# Patient Record
Sex: Male | Born: 1945 | Race: White | Hispanic: No | Marital: Married | Smoking: Former smoker
Health system: Southern US, Community
[De-identification: ages and names within clinical notes are randomized; demographics above are authoritative.]

---

## 2013-07-08 ENCOUNTER — Inpatient Hospital Stay (HOSPITAL_COMMUNITY)
Admission: EM | Admit: 2013-07-08 | Discharge: 2013-07-14 | DRG: 853 | Disposition: A | Discharge status: Home / Self Care | Payer: Medicare Other | Attending: Internal Medicine | Admitting: Internal Medicine

## 2013-07-08 DIAGNOSIS — E871 Hypo-osmolality and hyponatremia: Secondary | ICD-10-CM | POA: Diagnosis present

## 2013-07-08 DIAGNOSIS — D72829 Elevated white blood cell count, unspecified: Secondary | ICD-10-CM | POA: Diagnosis present

## 2013-07-08 DIAGNOSIS — I96 Gangrene, not elsewhere classified: Secondary | ICD-10-CM | POA: Diagnosis present

## 2013-07-08 DIAGNOSIS — L0231 Cutaneous abscess of buttock: Secondary | ICD-10-CM

## 2013-07-08 DIAGNOSIS — Z87891 Personal history of nicotine dependence: Secondary | ICD-10-CM

## 2013-07-08 DIAGNOSIS — M7150 Other bursitis, not elsewhere classified, unspecified site: Secondary | ICD-10-CM | POA: Diagnosis present

## 2013-07-08 DIAGNOSIS — IMO0002 Reserved for concepts with insufficient information to code with codable children: Secondary | ICD-10-CM | POA: Diagnosis present

## 2013-07-08 DIAGNOSIS — E1101 Type 2 diabetes mellitus with hyperosmolarity with coma: Secondary | ICD-10-CM

## 2013-07-08 DIAGNOSIS — S31809A Unspecified open wound of unspecified buttock, initial encounter: Secondary | ICD-10-CM | POA: Diagnosis present

## 2013-07-08 DIAGNOSIS — A419 Sepsis, unspecified organism: Principal | ICD-10-CM

## 2013-07-08 DIAGNOSIS — K5900 Constipation, unspecified: Secondary | ICD-10-CM | POA: Diagnosis present

## 2013-07-08 DIAGNOSIS — R739 Hyperglycemia, unspecified: Secondary | ICD-10-CM

## 2013-07-08 DIAGNOSIS — E1165 Type 2 diabetes mellitus with hyperglycemia: Secondary | ICD-10-CM

## 2013-07-08 DIAGNOSIS — E870 Hyperosmolality and hypernatremia: Secondary | ICD-10-CM | POA: Diagnosis present

## 2013-07-08 DIAGNOSIS — M009 Pyogenic arthritis, unspecified: Secondary | ICD-10-CM | POA: Diagnosis present

## 2013-07-08 DIAGNOSIS — R000 Tachycardia, unspecified: Secondary | ICD-10-CM

## 2013-07-08 LAB — BLOOD GAS, VENOUS
Acid-base deficit: 1.3 mmol/L (ref 0.0–2.0)
Bicarbonate: 21.2 mEq/L (ref 20.0–24.0)
TCO2: 18.7 mmol/L (ref 0–100)
pCO2, Ven: 30.4 mmHg — ABNORMAL LOW (ref 45.0–50.0)
pO2, Ven: 59.3 mmHg — ABNORMAL HIGH (ref 30.0–45.0)

## 2013-07-08 LAB — URINE MICROSCOPIC-ADD ON

## 2013-07-08 LAB — POCT I-STAT, CHEM 8
BUN: 15 mg/dL (ref 6–23)
Chloride: 92 mEq/L — ABNORMAL LOW (ref 96–112)
Creatinine, Ser: 0.9 mg/dL (ref 0.50–1.35)
Potassium: 4.1 mEq/L (ref 3.5–5.1)
Sodium: 125 mEq/L — ABNORMAL LOW (ref 135–145)

## 2013-07-08 LAB — CBC WITH DIFFERENTIAL/PLATELET
Eosinophils Relative: 0 % (ref 0–5)
HCT: 39.8 % (ref 39.0–52.0)
Hemoglobin: 13.8 g/dL (ref 13.0–17.0)
Lymphs Abs: 1 10*3/uL (ref 0.7–4.0)
MCV: 82.7 fL (ref 78.0–100.0)
Monocytes Relative: 6 % (ref 3–12)
Platelets: 344 10*3/uL (ref 150–400)
RBC: 4.81 MIL/uL (ref 4.22–5.81)
WBC: 20.1 10*3/uL — ABNORMAL HIGH (ref 4.0–10.5)

## 2013-07-08 LAB — URINALYSIS, ROUTINE W REFLEX MICROSCOPIC
Bilirubin Urine: NEGATIVE
Glucose, UA: >1000 mg/dL — AB
Ketones, ur: NEGATIVE mg/dL
Protein, ur: NEGATIVE mg/dL

## 2013-07-08 LAB — COMPREHENSIVE METABOLIC PANEL
AST: 17 U/L (ref 0–37)
Albumin: 2.5 g/dL — ABNORMAL LOW (ref 3.5–5.2)
Alkaline Phosphatase: 157 U/L — ABNORMAL HIGH (ref 39–117)
BUN: 15 mg/dL (ref 6–23)
Chloride: 86 mEq/L — ABNORMAL LOW (ref 96–112)
Potassium: 4.1 mEq/L (ref 3.5–5.1)
Total Bilirubin: 0.3 mg/dL (ref 0.3–1.2)

## 2013-07-08 LAB — GLUCOSE, CAPILLARY: Glucose-Capillary: 513 mg/dL — ABNORMAL HIGH (ref 70–99)

## 2013-07-08 MED ORDER — PIPERACILLIN-TAZOBACTAM 3.375 G IVPB 30 MIN
3.3750 g | Freq: Once | INTRAVENOUS | Status: AC
  Administered 2013-07-08: 3.375 g via INTRAVENOUS
  Filled 2013-07-08: qty 50

## 2013-07-08 MED ORDER — SODIUM CHLORIDE 0.9 % IV SOLN
INTRAVENOUS | Status: DC
  Administered 2013-07-09 (×2): via INTRAVENOUS

## 2013-07-08 MED ORDER — FENTANYL CITRATE 0.05 MG/ML IJ SOLN
100.0000 ug | Freq: Once | INTRAMUSCULAR | Status: AC
  Administered 2013-07-08: 100 ug via INTRAVENOUS
  Filled 2013-07-08: qty 2

## 2013-07-08 MED ORDER — VANCOMYCIN HCL IN DEXTROSE 1-5 GM/200ML-% IV SOLN
1000.0000 mg | Freq: Once | INTRAVENOUS | Status: AC
  Administered 2013-07-09: 1000 mg via INTRAVENOUS
  Filled 2013-07-08: qty 200

## 2013-07-08 MED ORDER — SODIUM CHLORIDE 0.9 % IV BOLUS (SEPSIS)
1000.0000 mL | Freq: Once | INTRAVENOUS | Status: AC
  Administered 2013-07-08: 1000 mL via INTRAVENOUS

## 2013-07-08 MED ORDER — SODIUM CHLORIDE 0.9 % IV SOLN
INTRAVENOUS | Status: DC
  Administered 2013-07-09: 3.6 [IU]/h via INTRAVENOUS
  Filled 2013-07-08: qty 1

## 2013-07-08 MED ORDER — SODIUM CHLORIDE 0.9 % IV BOLUS (SEPSIS)
2000.0000 mL | Freq: Once | INTRAVENOUS | Status: AC
  Administered 2013-07-08: 1000 mL via INTRAVENOUS

## 2013-07-08 NOTE — H&P (Signed)
[facility] History and Physical  [name] [name] ZOX:[id number] DOB: 1946-10-14 DOA: 07/08/2013  Referring physician: Fonnie Jarvis PCP: No primary provider on file.  Specialists: none  Chief Complaint: Abscess + and uncontrolled  HPI: [name] [name] is a 67 y.o. male with no known medical illnesses, who presented to [facility] emergency room 7/27 because with bleeding from an area on his buttocks. He, states he's had some pain and some discomfort in his buttocks. Over the past week and then subsequently developed bleeding from the buttock area and decided to seek medical attention. He states over the past week or so. He has had a very poor appetite, but increasing thirst. He states over the past, month he's felt thirsty to the point that he cannot get enough to drink. He did not think anything of this. When he was seen in the emergency room, he had incision and drainage of a 5 x 7 cm abscess on his right upper gluteal muscle, but was noted to also have a random blood sugar in the 700s-he's never been told he is diabetic and he has never seen a doctor for a while.  Review of Systems: The patient seems to have some chills and fevers on and off for the past week, no cough no cold no blurred vision, double vision is dysuria, hematuria, melena, hematemesis, weakness on any one side of body, nausea, vomiting, chest pain, sputum  No past medical history on file. No past surgical history on file. Social History:  has no tobacco, alcohol, and drug history on file. Patient lives at home with his wife is retired and used to work with [employer]  No Known Allergies  No family history on file. thinks that someone is then made had diabetes-no strokes, CAD, epilepsy, asthma or other issues in the past  Prior to Admission medications   Medication Sig Start Date End Date Taking? Authorizing Provider  aspirin 81 MG chewable tablet Chew 162 mg by mouth daily.    Yes Historical Provider, MD   Physical  Exam: Filed Vitals:   07/08/13 2135 07/08/13 2213  BP: 133/75 150/72  Pulse: 133 129  Temp: 99.3 F (37.4 C)   TempSrc: Oral   Resp: 16 18  Height: 5\' 11"  (1.803 m)   Weight: 77.111 kg (170 lb)   SpO2: 96% 95%     General:  Alert, doesn't, Caucasian male, no apparent distress  Eyes: EOMI, NCAT  ENT: No JVD, no bruit. No thyromegaly  Neck: C. above  Cardiovascular: S1, S2, slightly tachycardic 120 range  Respiratory: Clinically clear  Abdomen: Soft, nontender, nondistended  Skin: Area of abscess was incised and drained-no chest dressings and packing over the area. Per report from Dr Fonnie Jarvis patient had purulent material expressed out of it, but there was still some necrotic tissue  Musculoskeletal: Range of motion grossly intact  Psychiatric: Euthymic  Neurologic: Grossly intact  Labs on Admission:  Basic Metabolic Panel:  Recent Labs Lab 07/08/13 2152 07/08/13 2200  NA 121* 125*  K 4.1 4.1  CL 86* 92*  CO2 22  --   GLUCOSE 680* >700*  BUN 15 15  CREATININE 0.69 0.90  CALCIUM 9.0  --    Liver Function Tests:  Recent Labs Lab 07/08/13 2152  AST 17  ALT 16  ALKPHOS 157*  BILITOT 0.3  PROT 6.4  ALBUMIN 2.5*   No results found for this basename: LIPASE, AMYLASE,  in the last 168 hours No results found for this basename: AMMONIA,  in  the last 168 hours CBC:  Recent Labs Lab 07/08/13 2152 07/08/13 2200  WBC 20.1*  --   NEUTROABS 17.9*  --   HGB 13.8 14.6  HCT 39.8 43.0  MCV 82.7  --   PLT 344  --    Cardiac Enzymes: No results found for this basename: CKTOTAL, CKMB, CKMBINDEX, TROPONINI,  in the last 168 hours  BNP (last 3 results) No results found for this basename: PROBNP,  in the last 8760 hours CBG:  Recent Labs Lab 07/08/13 2343  GLUCAP 513*    Radiological Exams on Admission: No results found.  EKG: Independently reviewed. None performed  Assessment/Plan Principal Problem:   Sepsis Active Problems:   Hyperosmolar  (nonketotic) coma   Abscess, gluteal, right   Tachycardia   1. Sepsis-probably secondary to abscess-obtain blood cultures follow wound cultures, start vancomycin and Zosyn, broad-spectrum coverage. Will get CT abdomen pelvis today. He needs CT Abd pelvis to determine wound and how far tracks. He may need definitive surgical management-please call general surgeon with this information in the morning. 2. Hyperosmolar nonketotic state in a nondiabetic-get HbA1c we'll start patient on glucose stabilizer-he got also to 2-1/2 L in the emergency room.  Keep on 125cc per hour-nursing is to inform PA for transition orders-he will need diabetic teaching and likely short-term insulin. 3. Tachycardia-related to #1-will get EKG to insure that the sinus rhythm, also need to do this has preop 4. Dilutional hyponatremia secondary to #2 5. Hypocalcemia-this will hopefully correct 6. Altered LFTs-monitor. Repeat Cmet in morning  Please ask general surgeon to see patient in the morning  Code Status: Full  Family Communication: Plan at bedside  Disposition Plan: Step down telemetry 3-4 days   Time spent: 71  Mahala Menghini [facility] Pager [phone]  If 7PM-7AM, please contact night-coverage [email or web address] Password [email or web address] 07/08/2013, 11:52 PM

## 2013-07-08 NOTE — ED Notes (Signed)
Critical I stat Chem 8 (glu) - [place] reported to Dr Fonnie Jarvis 2208.

## 2013-07-08 NOTE — ED Notes (Signed)
Pt has large right abscess on left buttock, that is bleeding

## 2013-07-09 ENCOUNTER — Encounter (HOSPITAL_COMMUNITY): Payer: Self-pay

## 2013-07-09 ENCOUNTER — Inpatient Hospital Stay (HOSPITAL_COMMUNITY): Payer: Medicare Other

## 2013-07-09 ENCOUNTER — Emergency Department (HOSPITAL_COMMUNITY): Payer: Medicare Other

## 2013-07-09 ENCOUNTER — Encounter (HOSPITAL_COMMUNITY): Payer: Self-pay | Admitting: Certified Registered Nurse Anesthetist

## 2013-07-09 ENCOUNTER — Inpatient Hospital Stay (HOSPITAL_COMMUNITY): Payer: Medicare Other | Admitting: Certified Registered Nurse Anesthetist

## 2013-07-09 ENCOUNTER — Encounter (HOSPITAL_COMMUNITY)
Admission: EM | Disposition: A | Discharge status: Home / Self Care | Payer: Self-pay | Source: Home / Self Care | Attending: Internal Medicine

## 2013-07-09 DIAGNOSIS — A419 Sepsis, unspecified organism: Secondary | ICD-10-CM

## 2013-07-09 DIAGNOSIS — R000 Tachycardia, unspecified: Secondary | ICD-10-CM

## 2013-07-09 DIAGNOSIS — L0231 Cutaneous abscess of buttock: Secondary | ICD-10-CM

## 2013-07-09 DIAGNOSIS — IMO0001 Reserved for inherently not codable concepts without codable children: Secondary | ICD-10-CM

## 2013-07-09 DIAGNOSIS — L03317 Cellulitis of buttock: Secondary | ICD-10-CM

## 2013-07-09 DIAGNOSIS — E1101 Type 2 diabetes mellitus with hyperosmolarity with coma: Secondary | ICD-10-CM

## 2013-07-09 DIAGNOSIS — R7309 Other abnormal glucose: Secondary | ICD-10-CM

## 2013-07-09 HISTORY — PX: IRRIGATION AND DEBRIDEMENT ABSCESS: SHX5252

## 2013-07-09 LAB — CBC
HCT: 36.8 % — ABNORMAL LOW (ref 39.0–52.0)
Hemoglobin: 13 g/dL (ref 13.0–17.0)
MCHC: 35.5 g/dL (ref 30.0–36.0)
MCV: 81.8 fL (ref 78.0–100.0)
Platelets: 331 10*3/uL (ref 150–400)
RBC: 4.5 MIL/uL (ref 4.22–5.81)
RDW: 13 % (ref 11.5–15.5)
WBC: 19.4 10*3/uL — ABNORMAL HIGH (ref 4.0–10.5)

## 2013-07-09 LAB — COMPREHENSIVE METABOLIC PANEL
ALT: 14 U/L (ref 0–53)
Alkaline Phosphatase: 131 U/L — ABNORMAL HIGH (ref 39–117)
BUN: 9 mg/dL (ref 6–23)
CO2: 23 mEq/L (ref 19–32)
GFR calc Af Amer: >90 mL/min (ref >90)
GFR calc non Af Amer: >90 mL/min (ref >90)
Glucose, Bld: 136 mg/dL — ABNORMAL HIGH (ref 70–99)
Potassium: 3.8 mEq/L (ref 3.5–5.1)
Sodium: 130 mEq/L — ABNORMAL LOW (ref 135–145)
Total Bilirubin: 0.4 mg/dL (ref 0.3–1.2)

## 2013-07-09 LAB — GLUCOSE, CAPILLARY
Glucose-Capillary: 423 mg/dL — ABNORMAL HIGH (ref 70–99)
Glucose-Capillary: 230 mg/dL — ABNORMAL HIGH (ref 70–99)
Glucose-Capillary: 239 mg/dL — ABNORMAL HIGH (ref 70–99)
Glucose-Capillary: 269 mg/dL — ABNORMAL HIGH (ref 70–99)
Glucose-Capillary: 233 mg/dL — ABNORMAL HIGH (ref 70–99)

## 2013-07-09 LAB — BASIC METABOLIC PANEL
BUN: 10 mg/dL (ref 6–23)
CO2: 23 mEq/L (ref 19–32)
Chloride: 92 mEq/L — ABNORMAL LOW (ref 96–112)
Chloride: 98 mEq/L (ref 96–112)
Creatinine, Ser: 0.58 mg/dL (ref 0.50–1.35)
GFR calc Af Amer: >90 mL/min (ref >90)
GFR calc Af Amer: >90 mL/min (ref >90)
GFR calc non Af Amer: >90 mL/min (ref >90)
Potassium: 3.7 mEq/L (ref 3.5–5.1)
Potassium: 4.1 mEq/L (ref 3.5–5.1)
Sodium: 124 mEq/L — ABNORMAL LOW (ref 135–145)
Sodium: 131 mEq/L — ABNORMAL LOW (ref 135–145)

## 2013-07-09 SURGERY — IRRIGATION AND DEBRIDEMENT ABSCESS
Anesthesia: General | Site: Buttocks | Laterality: Right | Wound class: Dirty or Infected

## 2013-07-09 MED ORDER — LACTATED RINGERS IV SOLN
INTRAVENOUS | Status: DC | PRN
  Administered 2013-07-09 (×2): via INTRAVENOUS

## 2013-07-09 MED ORDER — ACETAMINOPHEN 10 MG/ML IV SOLN: 1000.0000 mg | Freq: Once | INTRAVENOUS | Status: DC | PRN

## 2013-07-09 MED ORDER — INSULIN ASPART 100 UNIT/ML ~~LOC~~ SOLN
0.0000 [IU] | Freq: Three times a day (TID) | SUBCUTANEOUS | Status: DC
  Administered 2013-07-09 (×2): 3 [IU] via SUBCUTANEOUS
  Administered 2013-07-10: 9 [IU] via SUBCUTANEOUS

## 2013-07-09 MED ORDER — DEXTROSE-NACL 5-0.45 % IV SOLN
INTRAVENOUS | Status: DC
  Administered 2013-07-09: 06:00:00 via INTRAVENOUS

## 2013-07-09 MED ORDER — SODIUM CHLORIDE 0.9 % IV SOLN
INTRAVENOUS | Status: DC
Start: 2013-07-09 — End: 2013-07-09

## 2013-07-09 MED ORDER — SODIUM CHLORIDE 0.9 % IV SOLN
INTRAVENOUS | Status: DC
  Filled 2013-07-09: qty 1

## 2013-07-09 MED ORDER — HEPARIN SODIUM (PORCINE) 5000 UNIT/ML IJ SOLN
5000.0000 [IU] | Freq: Three times a day (TID) | INTRAMUSCULAR | Status: DC
  Administered 2013-07-10 – 2013-07-14 (×11): 5000 [IU] via SUBCUTANEOUS
  Filled 2013-07-09 (×16): qty 1

## 2013-07-09 MED ORDER — INSULIN GLARGINE 100 UNIT/ML ~~LOC~~ SOLN
10.0000 [IU] | Freq: Every day | SUBCUTANEOUS | Status: DC
  Filled 2013-07-09: qty 0.1

## 2013-07-09 MED ORDER — HYDROMORPHONE HCL PF 1 MG/ML IJ SOLN
0.5000 mg | INTRAMUSCULAR | Status: DC | PRN
  Administered 2013-07-10 – 2013-07-14 (×6): 1 mg via INTRAVENOUS
  Filled 2013-07-09 (×6): qty 1

## 2013-07-09 MED ORDER — 0.9 % SODIUM CHLORIDE (POUR BTL) OPTIME
TOPICAL | Status: DC | PRN
  Administered 2013-07-09: 1000 mL

## 2013-07-09 MED ORDER — OXYCODONE HCL 5 MG/5ML PO SOLN
5.0000 mg | Freq: Once | ORAL | Status: DC | PRN
  Filled 2013-07-09: qty 5

## 2013-07-09 MED ORDER — FENTANYL CITRATE 0.05 MG/ML IJ SOLN
INTRAMUSCULAR | Status: DC | PRN
  Administered 2013-07-09 (×2): 50 ug via INTRAVENOUS

## 2013-07-09 MED ORDER — PIPERACILLIN-TAZOBACTAM 3.375 G IVPB
3.3750 g | Freq: Three times a day (TID) | INTRAVENOUS | Status: DC
  Administered 2013-07-09 – 2013-07-13 (×11): 3.375 g via INTRAVENOUS
  Filled 2013-07-09 (×16): qty 50

## 2013-07-09 MED ORDER — MIDAZOLAM HCL 5 MG/5ML IJ SOLN
INTRAMUSCULAR | Status: DC | PRN
  Administered 2013-07-09: 2 mg via INTRAVENOUS

## 2013-07-09 MED ORDER — PROMETHAZINE HCL 25 MG/ML IJ SOLN: 6.2500 mg | INTRAMUSCULAR | Status: DC | PRN

## 2013-07-09 MED ORDER — POTASSIUM CHLORIDE 10 MEQ/100ML IV SOLN
10.0000 meq | INTRAVENOUS | Status: AC
  Administered 2013-07-09 (×2): 10 meq via INTRAVENOUS
  Filled 2013-07-09: qty 200

## 2013-07-09 MED ORDER — SUCCINYLCHOLINE CHLORIDE 20 MG/ML IJ SOLN
INTRAMUSCULAR | Status: DC | PRN
  Administered 2013-07-09: 100 mg via INTRAVENOUS

## 2013-07-09 MED ORDER — OXYCODONE HCL 5 MG PO TABS: 5.0000 mg | ORAL_TABLET | Freq: Once | ORAL | Status: DC | PRN

## 2013-07-09 MED ORDER — PROPOFOL 10 MG/ML IV BOLUS
INTRAVENOUS | Status: DC | PRN
  Administered 2013-07-09: 50 mg via INTRAVENOUS
  Administered 2013-07-09: 150 mg via INTRAVENOUS

## 2013-07-09 MED ORDER — SODIUM CHLORIDE 0.9 % IV SOLN
INTRAVENOUS | Status: DC
  Administered 2013-07-09: 18:00:00 via INTRAVENOUS

## 2013-07-09 MED ORDER — HEPARIN SODIUM (PORCINE) 5000 UNIT/ML IJ SOLN
5000.0000 [IU] | Freq: Three times a day (TID) | INTRAMUSCULAR | Status: DC
  Administered 2013-07-09 (×2): 5000 [IU] via SUBCUTANEOUS
  Filled 2013-07-09 (×5): qty 1

## 2013-07-09 MED ORDER — HYDROMORPHONE HCL PF 1 MG/ML IJ SOLN: 0.2500 mg | INTRAMUSCULAR | Status: DC | PRN

## 2013-07-09 MED ORDER — IOHEXOL 300 MG/ML  SOLN
100.0000 mL | Freq: Once | INTRAMUSCULAR | Status: AC | PRN
  Administered 2013-07-09: 100 mL via INTRAVENOUS

## 2013-07-09 MED ORDER — LACTATED RINGERS IV SOLN: INTRAVENOUS | Status: DC

## 2013-07-09 MED ORDER — ALBUTEROL SULFATE (5 MG/ML) 0.5% IN NEBU
INHALATION_SOLUTION | RESPIRATORY_TRACT | Status: AC
  Filled 2013-07-09: qty 0.5

## 2013-07-09 MED ORDER — VANCOMYCIN HCL IN DEXTROSE 750-5 MG/150ML-% IV SOLN
750.0000 mg | Freq: Three times a day (TID) | INTRAVENOUS | Status: DC
  Administered 2013-07-09 – 2013-07-13 (×12): 750 mg via INTRAVENOUS
  Filled 2013-07-09 (×14): qty 150

## 2013-07-09 MED ORDER — CHLORHEXIDINE GLUCONATE CLOTH 2 % EX PADS
6.0000 | MEDICATED_PAD | Freq: Every day | CUTANEOUS | Status: AC
  Administered 2013-07-09 – 2013-07-13 (×5): 6 via TOPICAL

## 2013-07-09 MED ORDER — MUPIROCIN 2 % EX OINT
1.0000 "application " | TOPICAL_OINTMENT | Freq: Two times a day (BID) | CUTANEOUS | Status: AC
  Administered 2013-07-09 – 2013-07-13 (×10): 1 via NASAL
  Filled 2013-07-09: qty 22

## 2013-07-09 MED ORDER — LIVING WELL WITH DIABETES BOOK
Freq: Once | Status: AC
  Administered 2013-07-09: 1
  Filled 2013-07-09: qty 1

## 2013-07-09 MED ORDER — MEPERIDINE HCL 50 MG/ML IJ SOLN: 6.2500 mg | INTRAMUSCULAR | Status: DC | PRN

## 2013-07-09 MED ORDER — ALBUTEROL SULFATE (5 MG/ML) 0.5% IN NEBU
2.5000 mg | INHALATION_SOLUTION | Freq: Once | RESPIRATORY_TRACT | Status: AC
  Administered 2013-07-09: 2.5 mg via RESPIRATORY_TRACT

## 2013-07-09 MED ORDER — ASPIRIN 81 MG PO CHEW
162.0000 mg | CHEWABLE_TABLET | Freq: Every day | ORAL | Status: DC
  Filled 2013-07-09: qty 2

## 2013-07-09 MED ORDER — DEXTROSE 50 % IV SOLN: 25.0000 mL | INTRAVENOUS | Status: DC | PRN

## 2013-07-09 SURGICAL SUPPLY — 45 items
BANDAGE GAUZE ELAST BULKY 4 IN (GAUZE/BANDAGES/DRESSINGS) ×3 IMPLANT
BENZOIN TINCTURE PRP APPL 2/3 (GAUZE/BANDAGES/DRESSINGS) IMPLANT
BLADE HEX COATED 2.75 (ELECTRODE) ×6 IMPLANT
BLADE SURG 15 STRL LF DISP TIS (BLADE) ×4 IMPLANT
BLADE SURG 15 STRL SS (BLADE) ×2
BLADE SURG SZ10 CARB STEEL (BLADE) ×6 IMPLANT
CANISTER SUCTION 2500CC (MISCELLANEOUS) ×6 IMPLANT
CLOTH BEACON ORANGE TIMEOUT ST (SAFETY) ×6 IMPLANT
DECANTER SPIKE VIAL GLASS SM (MISCELLANEOUS) IMPLANT
DRAPE LAPAROSCOPIC ABDOMINAL (DRAPES) IMPLANT
DRAPE LAPAROTOMY T 102X78X121 (DRAPES) IMPLANT
DRAPE LAPAROTOMY TRNSV 102X78 (DRAPE) IMPLANT
DRAPE LG THREE QUARTER DISP (DRAPES) IMPLANT
DRAPE POUCH INSTRU U-SHP 10X18 (DRAPES) ×3 IMPLANT
DRSG PAD ABDOMINAL 8X10 ST (GAUZE/BANDAGES/DRESSINGS) ×3 IMPLANT
ELECT REM PT RETURN 9FT ADLT (ELECTROSURGICAL) ×6
ELECTRODE REM PT RTRN 9FT ADLT (ELECTROSURGICAL) ×4 IMPLANT
EVACUATOR SILICONE 100CC (DRAIN) IMPLANT
GAUZE PACKING 1 X5 YD ST (GAUZE/BANDAGES/DRESSINGS) ×3 IMPLANT
GLOVE BIO SURGEON STRL SZ8 (GLOVE) ×3 IMPLANT
GLOVE BIOGEL PI IND STRL 7.0 (GLOVE) ×2 IMPLANT
GLOVE BIOGEL PI INDICATOR 7.0 (GLOVE) ×1
GLOVE EUDERMIC 7 POWDERFREE (GLOVE) ×6 IMPLANT
GOWN STRL NON-REIN LRG LVL3 (GOWN DISPOSABLE) ×3 IMPLANT
GOWN STRL REIN XL XLG (GOWN DISPOSABLE) ×6 IMPLANT
KIT BASIN OR (CUSTOM PROCEDURE TRAY) ×6 IMPLANT
MANIFOLD NEPTUNE II (INSTRUMENTS) ×3 IMPLANT
MARKER SKIN DUAL TIP RULER LAB (MISCELLANEOUS) IMPLANT
NEEDLE HYPO 25X1 1.5 SAFETY (NEEDLE) ×6 IMPLANT
NS IRRIG 1000ML POUR BTL (IV SOLUTION) ×6 IMPLANT
PACK BASIC VI WITH GOWN DISP (CUSTOM PROCEDURE TRAY) ×6 IMPLANT
PENCIL BUTTON HOLSTER BLD 10FT (ELECTRODE) ×6 IMPLANT
POSITIONER SURGICAL ARM (MISCELLANEOUS) ×3 IMPLANT
SOL PREP POV-IOD 16OZ 10% (MISCELLANEOUS) ×3 IMPLANT
SPONGE GAUZE 4X4 12PLY (GAUZE/BANDAGES/DRESSINGS) ×6 IMPLANT
SPONGE LAP 18X18 X RAY DECT (DISPOSABLE) ×3 IMPLANT
SPONGE LAP 4X18 X RAY DECT (DISPOSABLE) ×6 IMPLANT
STAPLER VISISTAT 35W (STAPLE) IMPLANT
STRIP CLOSURE SKIN 1/2X4 (GAUZE/BANDAGES/DRESSINGS) IMPLANT
SUT MNCRL AB 4-0 PS2 18 (SUTURE) IMPLANT
SUT VIC AB 3-0 SH 18 (SUTURE) IMPLANT
SYR CONTROL 10ML LL (SYRINGE) ×6 IMPLANT
TOWEL OR 17X26 10 PK STRL BLUE (TOWEL DISPOSABLE) ×3 IMPLANT
WATER STERILE IRR 1500ML POUR (IV SOLUTION) ×3 IMPLANT
YANKAUER SUCT BULB TIP 10FT TU (MISCELLANEOUS) IMPLANT

## 2013-07-09 NOTE — Consult Note (Signed)
General surgery attending note:  I have interviewed and examined this patient this morning. I agree with the assessment and treatment plan outlined by [name] Manis white, PA  Significant left gluteal abscess appears superficial, not involving rectum or perineum or bone. This will need debridement in the operatinillscheduled for a debridement of left gluteal abscess in the operating room, hopefully at the end of the day today. I discussed the indications, details, techniques and numerous risks of surgery with [name] [name]. I discussed bleeding, infection, recurrence infection, nerve damage chronic pain or numbness, and other unforeseen problems. He understands all these issues and all his questions were answered. He agrees with this plan.  [name] [name]. [name] [name], M.D., [facility] General and Minimally invasive Surgery Breast and Colorectal Surgery Office:   [phone] Pager:   [phone]

## 2013-07-09 NOTE — Anesthesia Postprocedure Evaluation (Signed)
Anesthesia Post Note  Patient: [name] [name]  Procedure(s) Performed: Procedure(s) (LRB): IRRIGATION AND DEBRIDEMENT ABSCESS LEFT BUTTOCKS (Left)  Anesthesia type: General  Patient location: PACU  Post pain: Pain level controlled  Post assessment: Post-op Vital signs reviewed  Last Vitals: BP 123/59  Pulse 113  Temp(Src) 37.4 C (Oral)  Resp 22  Ht 5\' 11"  (1.803 m)  Wt 170 lb (77.111 kg)  BMI 23.72 kg/m2  SpO2 96%  Post vital signs: Reviewed  Level of consciousness: sedated  Complications: No apparent anesthesia complications

## 2013-07-09 NOTE — Progress Notes (Signed)
[facility] PROGRESS NOTE  [name] [name] ZOX:[id number] DOB: 08-Oct-1946 DOA: 07/08/2013 PCP: No primary provider on file.  Assessment/Plan: 1. Left Gluteal Abscess: Received one dose of vancomycin and Zosyn 7-27. Continue with antibiotics. Surgery consulted. CT scan with no definite abscess. HB A1c pending.  2. HONK, New diagnose of DM: On admission blood sugar more than 700, gap at 10. He was started on insulin Gtt. CBG has decrease to 100. Will transition to SSI and lantus. Diabetes educator consult.  3. Right elbow redness, swelling: Will check x ray. Will continue with IV antibiotics.  4. Early sepsis: Patient presents with leukocytosis, tachycardia, fever, source of infection gluteal abscess, elbow cellulitis. Continue with antibiotics. Monitor BP. Blood culture pending.  5. Increase Alk phosphatase: Trending down. Monitor.  6. Hyponatremia// Pseudo-hyponatremia: secondary to hyperglycemia, and secondary to decrease volume. Improving. Continue with IV fluids.   Code Status: Full Code.  Family Communication: Care discussed with the patient.  Disposition Plan: remain step down.    Consultants:  [place] [facility].   Procedures:  I and D in the ED 7-27  Antibiotics:  Vancomycin 7-27  Zosyn 7-27  HPI/Subjective: Relates mild pain right elbow. He has had gluteal abscess since 1 week ago.   Objective: Filed Vitals:   07/09/13 0500 07/09/13 0528 07/09/13 0600 07/09/13 0700  BP: 125/73  127/70 117/72  Pulse: 116  115 111  Temp:  99.8 F (37.7 C)    TempSrc:  Oral    Resp: 12  19 0  Height:      Weight:      SpO2: 94%  94% 91%    Intake/Output Summary (Last 24 hours) at 07/09/13 0753 Last data filed at 07/09/13 0700  Gross per 24 hour  Intake  858.3 ml  Output    100 ml  Net  758.3 ml   Filed Weights   07/08/13 2135  Weight: 77.111 kg (170 lb)    Exam:   General:  No distress.   Cardiovascular: S 1, S 2 RRR  Respiratory: CTA  Abdomen: not  evaluated.  Musculoskeletal: left gluteal muscle with 3 cm open wound, with blood, and necrotic area. Right elbow with redness, swelling.   Data Reviewed: Basic Metabolic Panel:  Recent Labs Lab 07/08/13 2152 07/08/13 2200 07/09/13 0220 07/09/13 0530  NA 121* 125* 124* 130*  K 4.1 4.1 3.7 3.8  CL 86* 92* 92* 96  CO2 22  --  21 23  GLUCOSE 680* >700* 398* 136*  BUN 15 15 10 9   CREATININE 0.69 0.90 0.64 0.64  CALCIUM 9.0  --  8.3* 8.8   Liver Function Tests:  Recent Labs Lab 07/08/13 2152 07/09/13 0530  AST 17 20  ALT 16 14  ALKPHOS 157* 131*  BILITOT 0.3 0.4  PROT 6.4 6.1  ALBUMIN 2.5* 2.3*   No results found for this basename: LIPASE, AMYLASE,  in the last 168 hours No results found for this basename: AMMONIA,  in the last 168 hours CBC:  Recent Labs Lab 07/08/13 2152 07/08/13 2200 07/09/13 0220 07/09/13 0530  WBC 20.1*  --  19.4* 21.6*  NEUTROABS 17.9*  --   --   --   HGB 13.8 14.6 13.0 13.6  HCT 39.8 43.0 36.8* 38.3*  MCV 82.7  --  81.8 82.2  PLT 344  --  323 331   Cardiac Enzymes: No results found for this basename: CKTOTAL, CKMB, CKMBINDEX, TROPONINI,  in the last 168 hours BNP (last 3 results) No results found  for this basename: PROBNP,  in the last 8760 hours CBG:  Recent Labs Lab 07/08/13 2343 07/09/13 0136  GLUCAP 513* 423*    Recent Results (from the past 240 hour(s))  MRSA PCR SCREENING     Status: Abnormal   Collection Time    07/09/13  1:30 AM      Result Value Range Status   MRSA by PCR POSITIVE (*) NEGATIVE Final   Comment:            The GeneXpert MRSA Assay (FDA     approved for NASAL specimens     only), is one component of a     comprehensive MRSA colonization     surveillance program. It is not     intended to diagnose MRSA     infection nor to guide or     monitor treatment for     MRSA infections.     RESULT CALLED TO, READ BACK BY AND VERIFIED WITH:     STERN,R/2W @0429  ON 07/09/13 BY KARCZEWSKI,S.      Studies: Ct Pelvis W Contrast  07/09/2013   *RADIOLOGY REPORT*  Clinical Data:  Bleeding left buttock abscess.  CT PELVIS WITH CONTRAST  Technique:  Multidetector CT imaging of the pelvis was performed using the standard protocol following the bolus administration of intravenous contrast.  Contrast: OMNIPAQUE IOHEXOL 300 MG/ML  SOLN  Comparison:   None.  Findings:  Subcutaneous inflammation in the superficial soft tissues overlying the left gluteal musculature present with a small foci of air present internally.  Fairly large area of inflammation measures approximately 2.2 cm in depth, 9 cm in width and 9.6 cm in craniocaudad height.  Internal density is consistent with complex fluid and the area does not show discrete areas of fluid density. No soft tissue foreign body is seen.  The rest of the pelvis is unremarkable and there is no evidence of intraperitoneal or retroperitoneal inflammation within the pelvis. Visualized bowel loops and bladder are unremarkable.  No bony abnormalities are seen.  IMPRESSION: Inflammatory process of the left buttocks in the superficial soft tissues.  This is largely an area of complex inflammation without discrete liquefied abscess.  Small foci of air are present in the area of inflammation.  There is no obvious extension into the deeper soft tissues.   Original Report Authenticated By: Irish Lack, M.D.    Scheduled Meds: . Chlorhexidine Gluconate Cloth  6 each Topical Q0600  . heparin  5,000 Units Subcutaneous Q8H  . insulin aspart  0-9 Units Subcutaneous TID WC  . insulin glargine  10 Units Subcutaneous QHS  . mupirocin ointment  1 application Nasal BID   Continuous Infusions: . sodium chloride Stopped (07/09/13 0540)  . sodium chloride    . dextrose 5 % and 0.45% NaCl 100 mL/hr at 07/09/13 0540    Principal Problem:   Sepsis Active Problems:   Hyperosmolar (nonketotic) coma   Abscess, gluteal, right   Tachycardia    Time spent: 35 minutes.      Meshelle Holness  [facility] Pager [phone]. If 7PM-7AM, please contact night-coverage at [email or web address], password [facility] 07/09/2013, 7:53 AM  LOS: 1 day

## 2013-07-09 NOTE — Brief Op Note (Signed)
07/08/2013 - 07/09/2013  4:56 PM  PATIENT:  [name] [name]  67 y.o. male  PRE-OPERATIVE DIAGNOSIS:  left buttock abcess  POST-OPERATIVE DIAGNOSIS:  left buttock abcess  PROCEDURE:  Debridement of skin, subcutaneous tissue and muscle fascia left gluteal area; 8 cm X 8 cm area  SURGEON:  Surgeon(s) and Role:    * Ernestene Mention, MD - Primary  PHYSICIAN ASSISTANT:   ASSISTANTS: none   ANESTHESIA:   general  EBL:  Total I/O In: 1987.5 [P.O.:75; I.V.:1750; IV Piggyback:162.5] Out: 175 [Urine:175]  BLOOD ADMINISTERED:none  DRAINS: none   LOCAL MEDICATIONS USED:  NONE  SPECIMEN:  Source of Specimen:  skin, subcutaneous tissue and muscle left gluteal area  DISPOSITION OF SPECIMEN:  PATHOLOGY  COUNTS:  YES  TOURNIQUET:  * No tourniquets in log *  DICTATION: .Dragon Dictation  PLAN OF CARE: Admit to inpatient   PATIENT DISPOSITION:  ICU - extubated and stable.   Delay start of Pharmacological VTE agent (>24hrs) due to surgical blood loss or risk of bleeding: yes

## 2013-07-09 NOTE — Consult Note (Signed)
Orthopaedic Trauma Service Consult  Reason for Consult: R elbow swelling/? Septic bursitis  Referring Physician: Lonia Farber MD [facility])  Patient was interviewed and examined jointly with Dr. Carola Frost in preop holding area.  HPI:   67 year old white male admitted yesterday evening for left buttock abscess. Patient also newly diagnosed with diabetes as he had blood sugars in the 700s. On rounds this morning patient was noted to have redness and some swelling to his right elbow. Orthopedics was consulted to evaluate. The patient is scheduled for I&D of abscess to his left this afternoon. Patient seen in the OR holding area. He complains of some right elbow pain. States that his right elbow has been swollen for about 3-4 days. Denies any acute trauma to the right elbow. He is able to move the elbow without tremendous difficulty.   Patient did have a I&D of the abscess yesterday in emergency department and this was attempted again today at the bedside but it was too painful. Patient has been on vancomycin and Zosyn since last night.  It appears blood cultures have been ordered on the patient however I do not see any cultures on the I&D specimens that were performed in the ED.  History reviewed. No pertinent past medical history.  No past surgical history on file.  No family history on file.  Social History:  reports that he has quit smoking. His smoking use included Cigarettes. He smoked 0.00 packs per day. He does not have any smokeless tobacco history on file. His alcohol and drug histories are not on file.  Allergies: No Known Allergies  Medications:  I have reviewed the patient's current medications. Scheduled: . [MAR HOLD] Chlorhexidine Gluconate Cloth  6 each Topical Q0600  . [MAR HOLD] insulin aspart  0-9 Units Subcutaneous TID WC  . [MAR HOLD] insulin glargine  10 Units Subcutaneous QHS  . [facility] HOLD] mupirocin ointment  1 application Nasal BID  . [MAR HOLD] piperacillin-tazobactam  (ZOSYN)  IV  3.375 g Intravenous Q8H  . [facility] HOLD] vancomycin  750 mg Intravenous Q8H   Continuous: . sodium chloride 125 mL/hr at 07/09/13 0800   PRN:[MAR HOLD] dextrose, [MAR HOLD]  HYDROmorphone (DILAUDID) injection  Results for orders placed during the hospital encounter of 07/08/13 (from the past 48 hour(s))  CBC WITH DIFFERENTIAL     Status: Abnormal   Collection Time    07/08/13  9:52 PM      Result Value Range   WBC 20.1 (*) 4.0 - 10.5 K/uL   RBC 4.81  4.22 - 5.81 MIL/uL   Hemoglobin 13.8  13.0 - 17.0 g/dL   HCT 40.9  81.1 - 91.4 %   MCV 82.7  78.0 - 100.0 fL   MCH 28.7  26.0 - 34.0 pg   MCHC 34.7  30.0 - 36.0 g/dL   RDW 78.2  95.6 - 21.3 %   Platelets 344  150 - 400 K/uL   Neutrophils Relative % 89 (*) 43 - 77 %   Lymphocytes Relative 5 (*) 12 - 46 %   Monocytes Relative 6  3 - 12 %   Eosinophils Relative 0  0 - 5 %   Basophils Relative 0  0 - 1 %   Neutro Abs 17.9 (*) 1.7 - 7.7 K/uL   Lymphs Abs 1.0  0.7 - 4.0 K/uL   Monocytes Absolute 1.2 (*) 0.1 - 1.0 K/uL   Eosinophils Absolute 0.0  0.0 - 0.7 K/uL   Basophils Absolute 0.0  0.0 -  0.1 K/uL   RBC Morphology POLYCHROMASIA PRESENT     WBC Morphology MILD LEFT SHIFT (1-5% METAS, OCC MYELO, OCC BANDS)    COMPREHENSIVE METABOLIC PANEL     Status: Abnormal   Collection Time    07/08/13  9:52 PM      Result Value Range   Sodium 121 (*) 135 - 145 mEq/L   Potassium 4.1  3.5 - 5.1 mEq/L   Chloride 86 (*) 96 - 112 mEq/L   CO2 22  19 - 32 mEq/L   Glucose, Bld 680 (*) 70 - 99 mg/dL   Comment: CRITICAL RESULT CALLED TO, READ BACK BY AND VERIFIED WITH:     FHOBSON RN AT 2225 ON [id number] BY DLONG   BUN 15  6 - 23 mg/dL   Creatinine, Ser 5.28  0.50 - 1.35 mg/dL   Calcium 9.0  8.4 - 41.3 mg/dL   Total Protein 6.4  6.0 - 8.3 g/dL   Albumin 2.5 (*) 3.5 - 5.2 g/dL   AST 17  0 - 37 U/L   ALT 16  0 - 53 U/L   Alkaline Phosphatase 157 (*) 39 - 117 U/L   Total Bilirubin 0.3  0.3 - 1.2 mg/dL   GFR calc non Af Amer >90  >90 mL/min    GFR calc Af Amer >90  >90 mL/min   Comment:            The eGFR has been calculated     using the CKD EPI equation.     This calculation has not been     validated in all clinical     situations.     eGFR's persistently     <90 mL/min signify     possible Chronic Kidney Disease.  POCT I-STAT, CHEM 8     Status: Abnormal   Collection Time    07/08/13 10:00 PM      Result Value Range   Sodium 125 (*) 135 - 145 mEq/L   Potassium 4.1  3.5 - 5.1 mEq/L   Chloride 92 (*) 96 - 112 mEq/L   BUN 15  6 - 23 mg/dL   Creatinine, Ser 2.44  0.50 - 1.35 mg/dL   Glucose, Bld >010 (*) 70 - 99 mg/dL   Calcium, Ion 2.72 (*) 1.13 - 1.30 mmol/L   TCO2 23  0 - 100 mmol/L   Hemoglobin 14.6  13.0 - 17.0 g/dL   HCT 53.6  64.4 - 03.4 %   Comment NOTIFIED PHYSICIAN    BLOOD GAS, VENOUS     Status: Abnormal   Collection Time    07/08/13 10:30 PM      Result Value Range   FIO2 0.21     pH, Ven 7.458 (*) 7.250 - 7.300   pCO2, Ven 30.4 (*) 45.0 - 50.0 mmHg   pO2, Ven 59.3 (*) 30.0 - 45.0 mmHg   Bicarbonate 21.2  20.0 - 24.0 mEq/L   TCO2 18.7  0 - 100 mmol/L   Acid-base deficit 1.3  0.0 - 2.0 mmol/L   O2 Saturation 90.6     Patient temperature 98.6     Collection site VEIN     Drawn by COLLECTED BY NURSE     Sample type VEIN    KETONES, QUALITATIVE     Status: Abnormal   Collection Time    07/08/13 10:44 PM      Result Value Range   Acetone, Bld SMALL (*) NEGATIVE  URINALYSIS, ROUTINE W REFLEX MICROSCOPIC  Status: Abnormal   Collection Time    07/08/13 10:56 PM      Result Value Range   Color, Urine YELLOW  YELLOW   APPearance CLEAR  CLEAR   Specific Gravity, Urine 1.038 (*) 1.005 - 1.030   pH 5.5  5.0 - 8.0   Glucose, UA >1000 (*) NEGATIVE mg/dL   Hgb urine dipstick TRACE (*) NEGATIVE   Bilirubin Urine NEGATIVE  NEGATIVE   Ketones, ur NEGATIVE  NEGATIVE mg/dL   Protein, ur NEGATIVE  NEGATIVE mg/dL   Urobilinogen, UA 0.2  0.0 - 1.0 mg/dL   Nitrite NEGATIVE  NEGATIVE   Leukocytes, UA  NEGATIVE  NEGATIVE  URINE MICROSCOPIC-ADD ON     Status: Abnormal   Collection Time    07/08/13 10:56 PM      Result Value Range   Squamous Epithelial / LPF RARE  RARE   WBC, UA 3-6  <3 WBC/hpf   RBC / HPF 0-2  <3 RBC/hpf   Bacteria, UA FEW (*) RARE  GLUCOSE, CAPILLARY     Status: Abnormal   Collection Time    07/08/13 11:43 PM      Result Value Range   Glucose-Capillary 513 (*) 70 - 99 mg/dL   Comment 1 Notify RN    MRSA PCR SCREENING     Status: Abnormal   Collection Time    07/09/13  1:30 AM      Result Value Range   MRSA by PCR POSITIVE (*) NEGATIVE   Comment:            The GeneXpert MRSA Assay (FDA     approved for NASAL specimens     only), is one component of a     comprehensive MRSA colonization     surveillance program. It is not     intended to diagnose MRSA     infection nor to guide or     monitor treatment for     MRSA infections.     RESULT CALLED TO, READ BACK BY AND VERIFIED WITH:     STERN,R/2W @0429  ON 07/09/13 BY KARCZEWSKI,S.  GLUCOSE, CAPILLARY     Status: Abnormal   Collection Time    07/09/13  1:36 AM      Result Value Range   Glucose-Capillary 423 (*) 70 - 99 mg/dL   Comment 1 Notify RN     Comment 2 Documented in Chart    BASIC METABOLIC PANEL     Status: Abnormal   Collection Time    07/09/13  2:20 AM      Result Value Range   Sodium 124 (*) 135 - 145 mEq/L   Potassium 3.7  3.5 - 5.1 mEq/L   Chloride 92 (*) 96 - 112 mEq/L   CO2 21  19 - 32 mEq/L   Glucose, Bld 398 (*) 70 - 99 mg/dL   BUN 10  6 - 23 mg/dL   Creatinine, Ser 5.62  0.50 - 1.35 mg/dL   Calcium 8.3 (*) 8.4 - 10.5 mg/dL   GFR calc non Af Amer >90  >90 mL/min   GFR calc Af Amer >90  >90 mL/min   Comment:            The eGFR has been calculated     using the CKD EPI equation.     This calculation has not been     validated in all clinical     situations.     eGFR's persistently     <  90 mL/min signify     possible Chronic Kidney Disease.  CBC     Status: Abnormal    Collection Time    07/09/13  2:20 AM      Result Value Range   WBC 19.4 (*) 4.0 - 10.5 K/uL   RBC 4.50  4.22 - 5.81 MIL/uL   Hemoglobin 13.0  13.0 - 17.0 g/dL   HCT 16.1 (*) 09.6 - 04.5 %   MCV 81.8  78.0 - 100.0 fL   MCH 28.9  26.0 - 34.0 pg   MCHC 35.3  30.0 - 36.0 g/dL   RDW 40.9  81.1 - 91.4 %   Platelets 323  150 - 400 K/uL  GLUCOSE, CAPILLARY     Status: Abnormal   Collection Time    07/09/13  3:15 AM      Result Value Range   Glucose-Capillary 412 (*) 70 - 99 mg/dL  GLUCOSE, CAPILLARY     Status: Abnormal   Collection Time    07/09/13  4:20 AM      Result Value Range   Glucose-Capillary 216 (*) 70 - 99 mg/dL  COMPREHENSIVE METABOLIC PANEL     Status: Abnormal   Collection Time    07/09/13  5:30 AM      Result Value Range   Sodium 130 (*) 135 - 145 mEq/L   Potassium 3.8  3.5 - 5.1 mEq/L   Chloride 96  96 - 112 mEq/L   CO2 23  19 - 32 mEq/L   Glucose, Bld 136 (*) 70 - 99 mg/dL   BUN 9  6 - 23 mg/dL   Creatinine, Ser 7.82  0.50 - 1.35 mg/dL   Calcium 8.8  8.4 - 95.6 mg/dL   Total Protein 6.1  6.0 - 8.3 g/dL   Albumin 2.3 (*) 3.5 - 5.2 g/dL   AST 20  0 - 37 U/L   ALT 14  0 - 53 U/L   Alkaline Phosphatase 131 (*) 39 - 117 U/L   Total Bilirubin 0.4  0.3 - 1.2 mg/dL   GFR calc non Af Amer >90  >90 mL/min   GFR calc Af Amer >90  >90 mL/min   Comment:            The eGFR has been calculated     using the CKD EPI equation.     This calculation has not been     validated in all clinical     situations.     eGFR's persistently     <90 mL/min signify     possible Chronic Kidney Disease.  CBC     Status: Abnormal   Collection Time    07/09/13  5:30 AM      Result Value Range   WBC 21.6 (*) 4.0 - 10.5 K/uL   RBC 4.66  4.22 - 5.81 MIL/uL   Hemoglobin 13.6  13.0 - 17.0 g/dL   HCT 21.3 (*) 08.6 - 57.8 %   MCV 82.2  78.0 - 100.0 fL   MCH 29.2  26.0 - 34.0 pg   MCHC 35.5  30.0 - 36.0 g/dL   RDW 46.9  62.9 - 52.8 %   Platelets 331  150 - 400 K/uL  GLUCOSE, CAPILLARY      Status: Abnormal   Collection Time    07/09/13  5:30 AM      Result Value Range   Glucose-Capillary 230 (*) 70 - 99 mg/dL  GLUCOSE, CAPILLARY     Status:  Abnormal   Collection Time    07/09/13  6:38 AM      Result Value Range   Glucose-Capillary 203 (*) 70 - 99 mg/dL  GLUCOSE, CAPILLARY     Status: Abnormal   Collection Time    07/09/13  7:45 AM      Result Value Range   Glucose-Capillary 106 (*) 70 - 99 mg/dL   Comment 1 Documented in Chart     Comment 2 Notify RN    BASIC METABOLIC PANEL     Status: Abnormal   Collection Time    07/09/13  7:55 AM      Result Value Range   Sodium 131 (*) 135 - 145 mEq/L   Potassium 4.1  3.5 - 5.1 mEq/L   Chloride 98  96 - 112 mEq/L   CO2 23  19 - 32 mEq/L   Glucose, Bld 92  70 - 99 mg/dL   BUN 9  6 - 23 mg/dL   Creatinine, Ser 4.09  0.50 - 1.35 mg/dL   Calcium 8.5  8.4 - 81.1 mg/dL   GFR calc non Af Amer >90  >90 mL/min   GFR calc Af Amer >90  >90 mL/min   Comment:            The eGFR has been calculated     using the CKD EPI equation.     This calculation has not been     validated in all clinical     situations.     eGFR's persistently     <90 mL/min signify     possible Chronic Kidney Disease.  GLUCOSE, CAPILLARY     Status: Abnormal   Collection Time    07/09/13 11:11 AM      Result Value Range   Glucose-Capillary 239 (*) 70 - 99 mg/dL   Comment 1 Documented in Chart     Comment 2 Notify RN      Dg Elbow Complete Right  07/09/2013   *RADIOLOGY REPORT*  Clinical Data: Right elbow swelling  RIGHT ELBOW - COMPLETE 3+ VIEW  Comparison: None.  Findings: No definite fracture is noted.  Abnormal anterior fat pad displacement is noted suggesting underlying joint effusion.  No dislocation is noted.  Joint spaces appear otherwise intact.  IMPRESSION: Abnormal anterior fat pad displacement is noted suggesting underlying joint effusion.  No definite fracture or dislocation is noted.   Original Report Authenticated By: Lupita Raider.,   M.D.   Ct Pelvis W Contrast  07/09/2013   *RADIOLOGY REPORT*  Clinical Data:  Bleeding left buttock abscess.  CT PELVIS WITH CONTRAST  Technique:  Multidetector CT imaging of the pelvis was performed using the standard protocol following the bolus administration of intravenous contrast.  Contrast: OMNIPAQUE IOHEXOL 300 MG/ML  SOLN  Comparison:   None.  Findings:  Subcutaneous inflammation in the superficial soft tissues overlying the left gluteal musculature present with a small foci of air present internally.  Fairly large area of inflammation measures approximately 2.2 cm in depth, 9 cm in width and 9.6 cm in craniocaudad height.  Internal density is consistent with complex fluid and the area does not show discrete areas of fluid density. No soft tissue foreign body is seen.  The rest of the pelvis is unremarkable and there is no evidence of intraperitoneal or retroperitoneal inflammation within the pelvis. Visualized bowel loops and bladder are unremarkable.  No bony abnormalities are seen.  IMPRESSION: Inflammatory process of the left buttocks in the  superficial soft tissues.  This is largely an area of complex inflammation without discrete liquefied abscess.  Small foci of air are present in the area of inflammation.  There is no obvious extension into the deeper soft tissues.   Original Report Authenticated By: Irish Lack, M.D.    Review of Systems  Constitutional: Positive for fever and chills.  Respiratory: Negative for shortness of breath.   Cardiovascular: Negative for chest pain.  Gastrointestinal: Negative for nausea, vomiting and abdominal pain.  Musculoskeletal:       Redness and swelling to right elbow  Neurological: Negative for tingling and sensory change.   Blood pressure 110/68, pulse 103, temperature 97.8 F (36.6 C), temperature source Oral, resp. rate 21, height 5\' 11"  (1.803 m), weight 77.111 kg (170 lb), SpO2 90.00%. Physical Exam  Constitutional: He appears  well-developed and well-nourished. No distress.  Musculoskeletal:  Right upper extremity   There is some redness to the tip of the right elbow. No open wounds or lesions are noted.   The patient is relatively nontender with palpation nor does he have any significant pain with active range of motion of his right elbow.  There is a very small area of fluid appreciated over the olecranon   Distal motor and sensory functions grossly intact   Palpable radial pulses appreciated   Extremity is warm  No significant swelling distally   No pain with palpation of the distal humerus or proximal radius and ulna    Assessment/Plan:  67 year old male with new diagnosis of diabetes with left gluteal abscess and right elbow swelling,  possible bursitis  1. Right elbow swelling/bursitis  There does appear to be any need for surgical intervention at this juncture as there is no significant area of fluctuance or fluid.  The patient will benefit from a compressive garment which was applied at the bedside.  We did mark the area of redness with a skin marker to monitor for any expansion  Patient will likely respond to current antibiotics that he is on and he is even stated that his elbow feels better than it did yesterday  We will reevaluate tomorrow.  No ROM or activity restrictions  Can use R arm as much as possible  Ice prn   2. left buttock abscess  Per general surgery  3. continue per primary team  4. ID  Selection based on cultures  Continue with vanc and zosyn for now   5. Dispo  Per primary service   Mearl Latin, PA-C [facility] [phone] (P) 07/09/2013, 2:56 PM    I saw and examined the patient together with Mr. Renae Fickle, determining the plan noted above.  Myrene Galas, MD [facility] [phone] (p)

## 2013-07-09 NOTE — Progress Notes (Signed)
Inpatient Diabetes Program Recommendations  AACE/ADA: New Consensus Statement on Inpatient Glycemic Control (2013)  Target Ranges:  Prepandial:   less than 140 mg/dL      Peak postprandial:   less than 180 mg/dL (1-2 hours)      Critically ill patients:  140 - 180 mg/dL   Referral for new onset DM.  Patient is gone to surgery therefore Diabetes Coordinator will follow up with patient tomorrow. Basic inpatient bedside education has been ordered for this patient: "Living Well with Diabetes" patient education workbook, Diabetes videos [id number] on Patient Education Network and RD consult. Thank you  Piedad Climes BSN, RN,CDE Inpatient Diabetes Coordinator [phone] (team pager)

## 2013-07-09 NOTE — Consult Note (Signed)
Reason for Consult: Left buttock abscess Referring Physician: Dr. Lonia Farber  [name] [name] is an 67 y.o. male.  HPI: 67 yr old male who presented to [facility] with one week history of buttocks discomfort, bleeding, fevers, chills and poor appetite.  He came to the ED due to bleeding from the wound on his buttocks.  It was attempted to be drained in the ED.  The patient was noted to have blood sugars in the 700s but has no known medical problems.  The area has some insensate areas but had pain with attempted debridement at bedside.  We are asked to evaluate if the wound needed to be cared for in the OR  History reviewed. No pertinent past medical history.  No past surgical history on file.  No family history on file.  Social History:  has no tobacco, alcohol, and drug history on file.  Allergies: No Known Allergies  Medications: I have reviewed the patient's current medications.  Results for orders placed during the hospital encounter of 07/08/13 (from the past 48 hour(s))  CBC WITH DIFFERENTIAL     Status: Abnormal   Collection Time    07/08/13  9:52 PM      Result Value Range   WBC 20.1 (*) 4.0 - 10.5 K/uL   RBC 4.81  4.22 - 5.81 MIL/uL   Hemoglobin 13.8  13.0 - 17.0 g/dL   HCT 16.1  09.6 - 04.5 %   MCV 82.7  78.0 - 100.0 fL   MCH 28.7  26.0 - 34.0 pg   MCHC 34.7  30.0 - 36.0 g/dL   RDW 40.9  81.1 - 91.4 %   Platelets 344  150 - 400 K/uL   Neutrophils Relative % 89 (*) 43 - 77 %   Lymphocytes Relative 5 (*) 12 - 46 %   Monocytes Relative 6  3 - 12 %   Eosinophils Relative 0  0 - 5 %   Basophils Relative 0  0 - 1 %   Neutro Abs 17.9 (*) 1.7 - 7.7 K/uL   Lymphs Abs 1.0  0.7 - 4.0 K/uL   Monocytes Absolute 1.2 (*) 0.1 - 1.0 K/uL   Eosinophils Absolute 0.0  0.0 - 0.7 K/uL   Basophils Absolute 0.0  0.0 - 0.1 K/uL   RBC Morphology POLYCHROMASIA PRESENT     WBC Morphology MILD LEFT SHIFT (1-5% METAS, OCC MYELO, OCC BANDS)    COMPREHENSIVE METABOLIC PANEL     Status: Abnormal    Collection Time    07/08/13  9:52 PM      Result Value Range   Sodium 121 (*) 135 - 145 mEq/L   Potassium 4.1  3.5 - 5.1 mEq/L   Chloride 86 (*) 96 - 112 mEq/L   CO2 22  19 - 32 mEq/L   Glucose, Bld 680 (*) 70 - 99 mg/dL   Comment: CRITICAL RESULT CALLED TO, READ BACK BY AND VERIFIED WITH:     FHOBSON RN AT 2225 ON [id number] BY DLONG   BUN 15  6 - 23 mg/dL   Creatinine, Ser 2.13  0.50 - 1.35 mg/dL   Calcium 9.0  8.4 - 08.6 mg/dL   Total Protein 6.4  6.0 - 8.3 g/dL   Albumin 2.5 (*) 3.5 - 5.2 g/dL   AST 17  0 - 37 U/L   ALT 16  0 - 53 U/L   Alkaline Phosphatase 157 (*) 39 - 117 U/L   Total Bilirubin 0.3  0.3 - 1.2  mg/dL   GFR calc non Af Amer >90  >90 mL/min   GFR calc Af Amer >90  >90 mL/min   Comment:            The eGFR has been calculated     using the CKD EPI equation.     This calculation has not been     validated in all clinical     situations.     eGFR's persistently     <90 mL/min signify     possible Chronic Kidney Disease.  POCT I-STAT, CHEM 8     Status: Abnormal   Collection Time    07/08/13 10:00 PM      Result Value Range   Sodium 125 (*) 135 - 145 mEq/L   Potassium 4.1  3.5 - 5.1 mEq/L   Chloride 92 (*) 96 - 112 mEq/L   BUN 15  6 - 23 mg/dL   Creatinine, Ser 1.61  0.50 - 1.35 mg/dL   Glucose, Bld >096 (*) 70 - 99 mg/dL   Calcium, Ion 0.45 (*) 1.13 - 1.30 mmol/L   TCO2 23  0 - 100 mmol/L   Hemoglobin 14.6  13.0 - 17.0 g/dL   HCT 40.9  81.1 - 91.4 %   Comment NOTIFIED PHYSICIAN    BLOOD GAS, VENOUS     Status: Abnormal   Collection Time    07/08/13 10:30 PM      Result Value Range   FIO2 0.21     pH, Ven 7.458 (*) 7.250 - 7.300   pCO2, Ven 30.4 (*) 45.0 - 50.0 mmHg   pO2, Ven 59.3 (*) 30.0 - 45.0 mmHg   Bicarbonate 21.2  20.0 - 24.0 mEq/L   TCO2 18.7  0 - 100 mmol/L   Acid-base deficit 1.3  0.0 - 2.0 mmol/L   O2 Saturation 90.6     Patient temperature 98.6     Collection site VEIN     Drawn by COLLECTED BY NURSE     Sample type VEIN    KETONES,  QUALITATIVE     Status: Abnormal   Collection Time    07/08/13 10:44 PM      Result Value Range   Acetone, Bld SMALL (*) NEGATIVE  URINALYSIS, ROUTINE W REFLEX MICROSCOPIC     Status: Abnormal   Collection Time    07/08/13 10:56 PM      Result Value Range   Color, Urine YELLOW  YELLOW   APPearance CLEAR  CLEAR   Specific Gravity, Urine 1.038 (*) 1.005 - 1.030   pH 5.5  5.0 - 8.0   Glucose, UA >1000 (*) NEGATIVE mg/dL   Hgb urine dipstick TRACE (*) NEGATIVE   Bilirubin Urine NEGATIVE  NEGATIVE   Ketones, ur NEGATIVE  NEGATIVE mg/dL   Protein, ur NEGATIVE  NEGATIVE mg/dL   Urobilinogen, UA 0.2  0.0 - 1.0 mg/dL   Nitrite NEGATIVE  NEGATIVE   Leukocytes, UA NEGATIVE  NEGATIVE  URINE MICROSCOPIC-ADD ON     Status: Abnormal   Collection Time    07/08/13 10:56 PM      Result Value Range   Squamous Epithelial / LPF RARE  RARE   WBC, UA 3-6  <3 WBC/hpf   RBC / HPF 0-2  <3 RBC/hpf   Bacteria, UA FEW (*) RARE  GLUCOSE, CAPILLARY     Status: Abnormal   Collection Time    07/08/13 11:43 PM      Result Value Range   Glucose-Capillary 513 (*)  70 - 99 mg/dL   Comment 1 Notify RN    MRSA PCR SCREENING     Status: Abnormal   Collection Time    07/09/13  1:30 AM      Result Value Range   MRSA by PCR POSITIVE (*) NEGATIVE   Comment:            The GeneXpert MRSA Assay (FDA     approved for NASAL specimens     only), is one component of a     comprehensive MRSA colonization     surveillance program. It is not     intended to diagnose MRSA     infection nor to guide or     monitor treatment for     MRSA infections.     RESULT CALLED TO, READ BACK BY AND VERIFIED WITH:     STERN,R/2W @0429  ON 07/09/13 BY KARCZEWSKI,S.  GLUCOSE, CAPILLARY     Status: Abnormal   Collection Time    07/09/13  1:36 AM      Result Value Range   Glucose-Capillary 423 (*) 70 - 99 mg/dL   Comment 1 Notify RN     Comment 2 Documented in Chart    BASIC METABOLIC PANEL     Status: Abnormal   Collection Time     07/09/13  2:20 AM      Result Value Range   Sodium 124 (*) 135 - 145 mEq/L   Potassium 3.7  3.5 - 5.1 mEq/L   Chloride 92 (*) 96 - 112 mEq/L   CO2 21  19 - 32 mEq/L   Glucose, Bld 398 (*) 70 - 99 mg/dL   BUN 10  6 - 23 mg/dL   Creatinine, Ser 1.61  0.50 - 1.35 mg/dL   Calcium 8.3 (*) 8.4 - 10.5 mg/dL   GFR calc non Af Amer >90  >90 mL/min   GFR calc Af Amer >90  >90 mL/min   Comment:            The eGFR has been calculated     using the CKD EPI equation.     This calculation has not been     validated in all clinical     situations.     eGFR's persistently     <90 mL/min signify     possible Chronic Kidney Disease.  CBC     Status: Abnormal   Collection Time    07/09/13  2:20 AM      Result Value Range   WBC 19.4 (*) 4.0 - 10.5 K/uL   RBC 4.50  4.22 - 5.81 MIL/uL   Hemoglobin 13.0  13.0 - 17.0 g/dL   HCT 09.6 (*) 04.5 - 40.9 %   MCV 81.8  78.0 - 100.0 fL   MCH 28.9  26.0 - 34.0 pg   MCHC 35.3  30.0 - 36.0 g/dL   RDW 81.1  91.4 - 78.2 %   Platelets 323  150 - 400 K/uL  GLUCOSE, CAPILLARY     Status: Abnormal   Collection Time    07/09/13  3:15 AM      Result Value Range   Glucose-Capillary 412 (*) 70 - 99 mg/dL  GLUCOSE, CAPILLARY     Status: Abnormal   Collection Time    07/09/13  4:20 AM      Result Value Range   Glucose-Capillary 216 (*) 70 - 99 mg/dL  COMPREHENSIVE METABOLIC PANEL     Status: Abnormal   Collection  Time    07/09/13  5:30 AM      Result Value Range   Sodium 130 (*) 135 - 145 mEq/L   Potassium 3.8  3.5 - 5.1 mEq/L   Chloride 96  96 - 112 mEq/L   CO2 23  19 - 32 mEq/L   Glucose, Bld 136 (*) 70 - 99 mg/dL   BUN 9  6 - 23 mg/dL   Creatinine, Ser 1.61  0.50 - 1.35 mg/dL   Calcium 8.8  8.4 - 09.6 mg/dL   Total Protein 6.1  6.0 - 8.3 g/dL   Albumin 2.3 (*) 3.5 - 5.2 g/dL   AST 20  0 - 37 U/L   ALT 14  0 - 53 U/L   Alkaline Phosphatase 131 (*) 39 - 117 U/L   Total Bilirubin 0.4  0.3 - 1.2 mg/dL   GFR calc non Af Amer >90  >90 mL/min   GFR  calc Af Amer >90  >90 mL/min   Comment:            The eGFR has been calculated     using the CKD EPI equation.     This calculation has not been     validated in all clinical     situations.     eGFR's persistently     <90 mL/min signify     possible Chronic Kidney Disease.  CBC     Status: Abnormal   Collection Time    07/09/13  5:30 AM      Result Value Range   WBC 21.6 (*) 4.0 - 10.5 K/uL   RBC 4.66  4.22 - 5.81 MIL/uL   Hemoglobin 13.6  13.0 - 17.0 g/dL   HCT 04.5 (*) 40.9 - 81.1 %   MCV 82.2  78.0 - 100.0 fL   MCH 29.2  26.0 - 34.0 pg   MCHC 35.5  30.0 - 36.0 g/dL   RDW 91.4  78.2 - 95.6 %   Platelets 331  150 - 400 K/uL  GLUCOSE, CAPILLARY     Status: Abnormal   Collection Time    07/09/13  5:30 AM      Result Value Range   Glucose-Capillary 230 (*) 70 - 99 mg/dL  GLUCOSE, CAPILLARY     Status: Abnormal   Collection Time    07/09/13  6:38 AM      Result Value Range   Glucose-Capillary 203 (*) 70 - 99 mg/dL  GLUCOSE, CAPILLARY     Status: Abnormal   Collection Time    07/09/13  7:45 AM      Result Value Range   Glucose-Capillary 106 (*) 70 - 99 mg/dL   Comment 1 Documented in Chart     Comment 2 Notify RN    BASIC METABOLIC PANEL     Status: Abnormal   Collection Time    07/09/13  7:55 AM      Result Value Range   Sodium 131 (*) 135 - 145 mEq/L   Potassium 4.1  3.5 - 5.1 mEq/L   Chloride 98  96 - 112 mEq/L   CO2 23  19 - 32 mEq/L   Glucose, Bld 92  70 - 99 mg/dL   BUN 9  6 - 23 mg/dL   Creatinine, Ser 2.13  0.50 - 1.35 mg/dL   Calcium 8.5  8.4 - 08.6 mg/dL   GFR calc non Af Amer >90  >90 mL/min   GFR calc Af Amer >90  >90 mL/min  Comment:            The eGFR has been calculated     using the CKD EPI equation.     This calculation has not been     validated in all clinical     situations.     eGFR's persistently     <90 mL/min signify     possible Chronic Kidney Disease.    Dg Elbow Complete Right  07/09/2013   *RADIOLOGY REPORT*  Clinical Data:  Right elbow swelling  RIGHT ELBOW - COMPLETE 3+ VIEW  Comparison: None.  Findings: No definite fracture is noted.  Abnormal anterior fat pad displacement is noted suggesting underlying joint effusion.  No dislocation is noted.  Joint spaces appear otherwise intact.  IMPRESSION: Abnormal anterior fat pad displacement is noted suggesting underlying joint effusion.  No definite fracture or dislocation is noted.   Original Report Authenticated By: Lupita Raider.,  M.D.   Ct Pelvis W Contrast  07/09/2013   *RADIOLOGY REPORT*  Clinical Data:  Bleeding left buttock abscess.  CT PELVIS WITH CONTRAST  Technique:  Multidetector CT imaging of the pelvis was performed using the standard protocol following the bolus administration of intravenous contrast.  Contrast: OMNIPAQUE IOHEXOL 300 MG/ML  SOLN  Comparison:   None.  Findings:  Subcutaneous inflammation in the superficial soft tissues overlying the left gluteal musculature present with a small foci of air present internally.  Fairly large area of inflammation measures approximately 2.2 cm in depth, 9 cm in width and 9.6 cm in craniocaudad height.  Internal density is consistent with complex fluid and the area does not show discrete areas of fluid density. No soft tissue foreign body is seen.  The rest of the pelvis is unremarkable and there is no evidence of intraperitoneal or retroperitoneal inflammation within the pelvis. Visualized bowel loops and bladder are unremarkable.  No bony abnormalities are seen.  IMPRESSION: Inflammatory process of the left buttocks in the superficial soft tissues.  This is largely an area of complex inflammation without discrete liquefied abscess.  Small foci of air are present in the area of inflammation.  There is no obvious extension into the deeper soft tissues.   Original Report Authenticated By: Irish Lack, M.D.    Review of Systems  Constitutional: Positive for fever, chills and malaise/fatigue.       Poor appetite    HENT: Negative.   Eyes: Negative.   Respiratory: Negative.   Cardiovascular: Negative.   Gastrointestinal: Negative.   Genitourinary: Negative.   Musculoskeletal: Negative.   Skin:       Wound on left buttocks  Neurological: Negative.   Endo/Heme/Allergies: Negative.   Psychiatric/Behavioral: Negative.    Blood pressure 121/71, pulse 110, temperature 100.8 F (38.2 C), temperature source Axillary, resp. rate 22, height 5\' 11"  (1.803 m), weight 170 lb (77.111 kg), SpO2 94.00%. Physical Exam  Nursing note and vitals reviewed. Constitutional: He is oriented to person, place, and time. He appears well-developed and well-nourished.  HENT:  Head: Normocephalic and atraumatic.  Eyes: Conjunctivae are normal. Pupils are equal, round, and reactive to light.  Neck: Normal range of motion. Neck supple.  Cardiovascular: Normal rate and regular rhythm.   Respiratory: Effort normal and breath sounds normal.  GI: Soft. Bowel sounds are normal.  Musculoskeletal: Normal range of motion.  Neurological: He is alert and oriented to person, place, and time.  Skin:  4.5cm x 5cm round wound on left medial buttocks Necrotic patch with incision down  center Lots of purulent drainage Liquifed tissue under patch extending 2-3 cm deep at least Small rim of surrounding erythema Attempted to debride at bedside but too tender  Psychiatric: He has a normal mood and affect. His behavior is normal. Thought content normal.    Assessment/Plan: Large Left buttocks with newly diagnosed uncontrolled diabetes: I attempted to debride the wound at bedside but it is very large and he does have significant pain with attempt.  Given the size of the wound, the new diagnosis of diabetes, fevers and leukocytosis the patient would benefit from more thorough debridement in the OR.  Discussed this with the patient who understands.  Will plan to do this later today.  Will likely need wet to dry dressings after that and if the  wound cleans up well with wet to dry a wound vac could be considered.  [name] [name] 07/09/2013, 9:05 AM

## 2013-07-09 NOTE — Progress Notes (Signed)
ANTIBIOTIC CONSULT NOTE - INITIAL  Pharmacy Consult for Vancomycin, Zosyn Indication: Gluteal Abscess  No Known Allergies  Patient Measurements: Height: 5\' 11"  (180.3 cm) Weight: 170 lb (77.111 kg) IBW/kg (Calculated) : 75.3  Vital Signs: Temp: 100.8 F (38.2 C) (07/28 0803) Temp src: Axillary (07/28 0803) BP: 121/71 mmHg (07/28 0803) Pulse Rate: 110 (07/28 0803) Intake/Output from previous day: 07/27 0701 - 07/28 0700 In: 858.3 [I.V.:658.3; IV Piggyback:200] Out: 100 [Urine:100] Intake/Output from this shift:    Labs:  Recent Labs  07/08/13 2152 07/08/13 2200 07/09/13 0220 07/09/13 0530  WBC 20.1*  --  19.4* 21.6*  HGB 13.8 14.6 13.0 13.6  PLT 344  --  323 331  CREATININE 0.69 0.90 0.64 0.64   Estimated Creatinine Clearance: 96.7 ml/min (by C-G formula based on Cr of 0.64). No results found for this basename: VANCOTROUGH, VANCOPEAK, VANCORANDOM, GENTTROUGH, GENTPEAK, GENTRANDOM, TOBRATROUGH, TOBRAPEAK, TOBRARND, AMIKACINPEAK, AMIKACINTROU, AMIKACIN,  in the last 72 hours     Assessment: 60 yom with no significant PMH presented 7/27 with bleeding L gluteal abscess. I&D of abscess performed in the ED and patient also noted to have random blood sugar in the 700s. Vanc/Zosyn x 1 given in the ED, MD ordered for pharmacy to continue dosing while inpatient.   Tmax 100.8, WBC 21.6K, Scr 0.62 for CG CrCl of 97 ml/min and normalized CrCl of 92 ml/min  Urine culture and blood cultures pending. No abscess culture ordered.  Goal of Therapy:  Vancomycin trough level 15-20 mcg/ml  Plan:   Vancomycin 750 mg IV q8h  Zosyn 3.375 gm IV q8h extended dosing interval over 4 hours  Pharmacy will f/u  Geoffry Paradise, PharmD, BCPS Pager: [phone] 8:18 AM Pharmacy #: [id number]

## 2013-07-09 NOTE — Progress Notes (Signed)
CARE MANAGEMENT NOTE 07/09/2013  Patient:  [name] [name], [name] [name]   Account Number:  [id number]  Date Initiated:  07/09/2013  Documentation initiated by:  Zyion Doxtater  Subjective/Objective Assessment:   pt admitted from earlier visit to [facility] due to buttock abcess found to be new diabetic admitted to [facility] [address],     Action/Plan:   home when stable will need wound and diabetic teaching.   Anticipated DC Date:  07/12/2013   Anticipated DC Plan:  HOME W HOME HEALTH SERVICES  In-house referral  NA      DC Planning Services  CM consult      [facility] Choice  NA   Choice offered to / List presented to:  NA   DME arranged  NA      DME agency  NA     HH arranged  NA      HH agency  NA   Status of service:  In process, will continue to follow Medicare Important Message given?  NA - LOS <3 / Initial given by admissions (If response is "NO", the following Medicare IM given date fields will be blank) Date Medicare IM given:   Date Additional Medicare IM given:    Discharge Disposition:    Per UR Regulation:  Reviewed for med. necessity/level of care/duration of stay  If discussed at Long Length of Stay Meetings, dates discussed:    Comments:  [id number] Earlene Plater RN, BSN, CCN: [phone] Case management. Chart reviewed for discharge planning and present needs. Discharge needs: none present at time of review. Next chart review due:  [id number]

## 2013-07-09 NOTE — Transfer of Care (Signed)
Immediate Anesthesia Transfer of Care Note  Patient: [name] [name]  Procedure(s) Performed: Procedure(s): IRRIGATION AND DEBRIDEMENT ABSCESS LEFT BUTTOCKS (Left)  Patient Location: PACU  Anesthesia Type:General  Level of Consciousness: awake, sedated and patient cooperative  Airway & Oxygen Therapy: Patient Spontanous Breathing and Patient connected to face mask oxygen  Post-op Assessment: Report given to PACU RN and Post -op Vital signs reviewed and stable  Post vital signs: Reviewed and stable  Complications: No apparent anesthesia complications

## 2013-07-09 NOTE — Anesthesia Preprocedure Evaluation (Signed)
Anesthesia Evaluation  Patient identified by MRN, date of birth, ID band Patient awake    Reviewed: Allergy & Precautions, H&P , NPO status , Patient's Chart, lab work & pertinent test results  Airway       Dental  (+) Dental Advisory Given   Pulmonary former smoker,          Cardiovascular negative cardio ROS      Neuro/Psych negative neurological ROS  negative psych ROS   GI/Hepatic negative GI ROS, Neg liver ROS,   Endo/Other  diabetes  Renal/GU negative Renal ROS     Musculoskeletal negative musculoskeletal ROS (+)   Abdominal   Peds  Hematology negative hematology ROS (+)   Anesthesia Other Findings   Reproductive/Obstetrics negative OB ROS                           Anesthesia Physical Anesthesia Plan  ASA: II  Anesthesia Plan: General   Post-op Pain Management:    Induction: Intravenous  Airway Management Planned: LMA and Oral ETT  Additional Equipment:   Intra-op Plan:   Post-operative Plan: Extubation in OR  Informed Consent: I have reviewed the patients History and Physical, chart, labs and discussed the procedure including the risks, benefits and alternatives for the proposed anesthesia with the patient or authorized representative who has indicated his/her understanding and acceptance.   Dental advisory given  Plan Discussed with: CRNA  Anesthesia Plan Comments:         Anesthesia Quick Evaluation

## 2013-07-09 NOTE — Progress Notes (Signed)
Attempted to visit pt to provide diabetes nutrition education but, pt gone to OR. Will follow-up with pt tomorrow. Ian Malkin RD, LDN Pager: [phone]

## 2013-07-09 NOTE — ED Provider Notes (Signed)
CSN: [id number]     Arrival date & time 07/08/13  2130 History     First MD Initiated Contact with Patient 07/08/13 2134     Chief Complaint  Patient presents with  . Abscess   (Consider location/radiation/quality/duration/timing/severity/associated sxs/prior Treatment) HPI 67 year old male has a one-month of polydipsia and polyuria with several days of left buttocks abscess with some spontaneous bleeding for the last few days, he has no fever no headache no confusion, he does have a dry mouth, he is no stiff neck chest pain cough shortness of breath abdominal pain vomiting diarrhea or dysuria, he denies any focal or localizing weakness or numbness, there is no treatment prior to arrival, his gluteal abscess pain is moderately severe worse with palpation without radiation or associated symptoms. He does not have a primary care doctor and is not known to be a diabetic. History reviewed. No pertinent past medical history. Past Surgical History  Procedure Laterality Date  . Irrigation and debridement abscess Left 07/09/2013    Procedure: IRRIGATION AND DEBRIDEMENT ABSCESS LEFT BUTTOCKS;  Surgeon: Ernestene Mention, MD;  Location: [facility];  Service: General;  Laterality: Left;   No family history on file. History  Substance Use Topics  . Smoking status: Former Smoker    Types: Cigarettes  . Smokeless tobacco: Not on file  . Alcohol Use: Not on file    Review of Systems 10 Systems reviewed and are negative for acute change except as noted in the HPI. Allergies  Review of patient's allergies indicates no known allergies.  Home Medications   No current outpatient prescriptions on file. BP 129/56  Pulse 109  Temp(Src) 99.6 F (37.6 C) (Oral)  Resp 26  Ht 5\' 11"  (1.803 m)  Wt 170 lb (77.111 kg)  BMI 23.72 kg/m2  SpO2 96% Physical Exam  Nursing note and vitals reviewed. Constitutional:  Awake, alert, nontoxic appearance.  HENT:  Head: Atraumatic.  Dry mouth  Eyes: Right eye  exhibits no discharge. Left eye exhibits no discharge.  Neck: Neck supple.  Cardiovascular: Regular rhythm.   No murmur heard. Tachycardic  Pulmonary/Chest: Effort normal and breath sounds normal. No respiratory distress. He has no wheezes. He has no rales. He exhibits no tenderness.  Abdominal: Soft. Bowel sounds are normal. He exhibits no distension and no mass. There is no tenderness. There is no rebound and no guarding.  Musculoskeletal: He exhibits tenderness.  Baseline ROM, no obvious new focal weakness.  Localized left gluteal abscess several centimeters diameter with mild surrounding cellulitis with necrotic fluctuance central region with some spontaneous purulent and bloody discharge.  Neurological: He is alert.  Mental status and motor strength appears baseline for patient and situation.  Skin: No rash noted.  Psychiatric: He has a normal mood and affect.    ED Course  Patient / Family / Caregiver understand and agree with initial ED impression and plan with expectations set for ED visit.  INCISION AND DRAINAGE Performed by: Hurman Horn Consent: Verbal consent obtained. Risks and benefits: risks, benefits and alternatives were discussed Time out performed prior to procedure Type: abscess Body area: left buttocks Anesthesia: local infiltration Incision was made with a scalpel. Local anesthetic: lidocaine 2% with epinephrine Anesthetic total: 10 ml Complexity: complex Blunt dissection to break up loculations Drainage: purulent Drainage amount: copious Packing material: 4x4's Patient tolerance: Patient tolerated the procedure well with no immediate complications.  Patient / Family / Caregiver informed of clinical course, understand medical decision-making process, and agree with plan. D/w [facility]  for admit. Procedures (including critical care time)  Labs Reviewed  MRSA PCR SCREENING - Abnormal; Notable for the following:    MRSA by PCR POSITIVE (*)    All other  components within normal limits  CBC WITH DIFFERENTIAL - Abnormal; Notable for the following:    WBC 20.1 (*)    Neutrophils Relative % 89 (*)    Lymphocytes Relative 5 (*)    Neutro Abs 17.9 (*)    Monocytes Absolute 1.2 (*)    All other components within normal limits  COMPREHENSIVE METABOLIC PANEL - Abnormal; Notable for the following:    Sodium 121 (*)    Chloride 86 (*)    Glucose, Bld 680 (*)    Albumin 2.5 (*)    Alkaline Phosphatase 157 (*)    All other components within normal limits  URINALYSIS, ROUTINE W REFLEX MICROSCOPIC - Abnormal; Notable for the following:    Specific Gravity, Urine 1.038 (*)    Glucose, UA >1000 (*)    Hgb urine dipstick TRACE (*)    All other components within normal limits  KETONES, QUALITATIVE - Abnormal; Notable for the following:    Acetone, Bld SMALL (*)    All other components within normal limits  BLOOD GAS, VENOUS - Abnormal; Notable for the following:    pH, Ven 7.458 (*)    pCO2, Ven 30.4 (*)    pO2, Ven 59.3 (*)    All other components within normal limits  URINE MICROSCOPIC-ADD ON - Abnormal; Notable for the following:    Bacteria, UA FEW (*)    All other components within normal limits  GLUCOSE, CAPILLARY - Abnormal; Notable for the following:    Glucose-Capillary 513 (*)    All other components within normal limits  BASIC METABOLIC PANEL - Abnormal; Notable for the following:    Sodium 124 (*)    Chloride 92 (*)    Glucose, Bld 398 (*)    Calcium 8.3 (*)    All other components within normal limits  BASIC METABOLIC PANEL - Abnormal; Notable for the following:    Sodium 131 (*)    All other components within normal limits  CBC - Abnormal; Notable for the following:    WBC 19.4 (*)    HCT 36.8 (*)    All other components within normal limits  HEMOGLOBIN A1C - Abnormal; Notable for the following:    Hemoglobin A1C 12.5 (*)    Mean Plasma Glucose 312 (*)    All other components within normal limits  COMPREHENSIVE  METABOLIC PANEL - Abnormal; Notable for the following:    Sodium 130 (*)    Glucose, Bld 136 (*)    Albumin 2.3 (*)    Alkaline Phosphatase 131 (*)    All other components within normal limits  CBC - Abnormal; Notable for the following:    WBC 21.6 (*)    HCT 38.3 (*)    All other components within normal limits  GLUCOSE, CAPILLARY - Abnormal; Notable for the following:    Glucose-Capillary 423 (*)    All other components within normal limits  GLUCOSE, CAPILLARY - Abnormal; Notable for the following:    Glucose-Capillary 412 (*)    All other components within normal limits  GLUCOSE, CAPILLARY - Abnormal; Notable for the following:    Glucose-Capillary 216 (*)    All other components within normal limits  GLUCOSE, CAPILLARY - Abnormal; Notable for the following:    Glucose-Capillary 230 (*)    All other  components within normal limits  GLUCOSE, CAPILLARY - Abnormal; Notable for the following:    Glucose-Capillary 203 (*)    All other components within normal limits  GLUCOSE, CAPILLARY - Abnormal; Notable for the following:    Glucose-Capillary 106 (*)    All other components within normal limits  GLUCOSE, CAPILLARY - Abnormal; Notable for the following:    Glucose-Capillary 239 (*)    All other components within normal limits  GLUCOSE, CAPILLARY - Abnormal; Notable for the following:    Glucose-Capillary 233 (*)    All other components within normal limits  CBC - Abnormal; Notable for the following:    WBC 21.4 (*)    RBC 3.95 (*)    Hemoglobin 11.8 (*)    HCT 33.3 (*)    All other components within normal limits  BASIC METABOLIC PANEL - Abnormal; Notable for the following:    Sodium 130 (*)    CO2 18 (*)    Glucose, Bld 304 (*)    Calcium 7.8 (*)    GFR calc non Af Amer 86 (*)    All other components within normal limits  GLUCOSE, CAPILLARY - Abnormal; Notable for the following:    Glucose-Capillary 269 (*)    All other components within normal limits  GLUCOSE,  CAPILLARY - Abnormal; Notable for the following:    Glucose-Capillary 253 (*)    All other components within normal limits  GLUCOSE, CAPILLARY - Abnormal; Notable for the following:    Glucose-Capillary 287 (*)    All other components within normal limits  GLUCOSE, CAPILLARY - Abnormal; Notable for the following:    Glucose-Capillary 292 (*)    All other components within normal limits  GLUCOSE, CAPILLARY - Abnormal; Notable for the following:    Glucose-Capillary 239 (*)    All other components within normal limits  BASIC METABOLIC PANEL - Abnormal; Notable for the following:    Sodium 127 (*)    Chloride 94 (*)    CO2 17 (*)    Glucose, Bld 286 (*)    Calcium 7.6 (*)    GFR calc non Af Amer 85 (*)    All other components within normal limits  GLUCOSE, CAPILLARY - Abnormal; Notable for the following:    Glucose-Capillary 359 (*)    All other components within normal limits  BASIC METABOLIC PANEL - Abnormal; Notable for the following:    Sodium 129 (*)    Glucose, Bld 281 (*)    Calcium 7.9 (*)    GFR calc non Af Amer 75 (*)    GFR calc Af Amer 86 (*)    All other components within normal limits  BASIC METABOLIC PANEL - Abnormal; Notable for the following:    Sodium 131 (*)    Glucose, Bld 258 (*)    Calcium 8.0 (*)    GFR calc non Af Amer 84 (*)    All other components within normal limits  BASIC METABOLIC PANEL - Abnormal; Notable for the following:    Sodium 132 (*)    Glucose, Bld 227 (*)    Calcium 8.0 (*)    GFR calc non Af Amer 84 (*)    All other components within normal limits  BASIC METABOLIC PANEL - Abnormal; Notable for the following:    Sodium 130 (*)    Glucose, Bld 181 (*)    Calcium 8.0 (*)    GFR calc non Af Amer 84 (*)    All other components within normal  limits  CBC - Abnormal; Notable for the following:    WBC 21.8 (*)    RBC 4.11 (*)    Hemoglobin 12.0 (*)    HCT 34.9 (*)    All other components within normal limits  GLUCOSE, CAPILLARY -  Abnormal; Notable for the following:    Glucose-Capillary 370 (*)    All other components within normal limits  GLUCOSE, CAPILLARY - Abnormal; Notable for the following:    Glucose-Capillary 268 (*)    All other components within normal limits  GLUCOSE, CAPILLARY - Abnormal; Notable for the following:    Glucose-Capillary 245 (*)    All other components within normal limits  GLUCOSE, CAPILLARY - Abnormal; Notable for the following:    Glucose-Capillary 200 (*)    All other components within normal limits  GLUCOSE, CAPILLARY - Abnormal; Notable for the following:    Glucose-Capillary 215 (*)    All other components within normal limits  GLUCOSE, CAPILLARY - Abnormal; Notable for the following:    Glucose-Capillary 188 (*)    All other components within normal limits  GLUCOSE, CAPILLARY - Abnormal; Notable for the following:    Glucose-Capillary 159 (*)    All other components within normal limits  BASIC METABOLIC PANEL - Abnormal; Notable for the following:    Sodium 127 (*)    Glucose, Bld 132 (*)    Calcium 7.7 (*)    GFR calc non Af Amer 84 (*)    All other components within normal limits  POCT I-STAT, CHEM 8 - Abnormal; Notable for the following:    Sodium 125 (*)    Chloride 92 (*)    Glucose, Bld >700 (*)    Calcium, Ion 1.08 (*)    All other components within normal limits  URINE CULTURE  CULTURE, BLOOD (ROUTINE X 2)  CULTURE, BLOOD (ROUTINE X 2)  VANCOMYCIN, TROUGH  BASIC METABOLIC PANEL  CBC WITH DIFFERENTIAL  BASIC METABOLIC PANEL  BASIC METABOLIC PANEL  BASIC METABOLIC PANEL  BASIC METABOLIC PANEL  BASIC METABOLIC PANEL   Dg Chest 2 View  07/10/2013   *RADIOLOGY REPORT*  Clinical Data: Leukocytosis  CHEST - 2 VIEW  Comparison: None.  Findings: Cardiomegaly is noted.  No acute infiltrate or pulmonary edema.  Elevation of the right hemidiaphragm.  Question trace right pleural effusion.  Thoracic spine osteopenia.  IMPRESSION: No acute infiltrate or pulmonary  edema.  Elevation of the right hemidiaphragm.  Cardiomegaly.  Question trace right pleural effusion.   Original Report Authenticated By: Natasha Mead, M.D.   Dg Elbow Complete Right  07/09/2013   *RADIOLOGY REPORT*  Clinical Data: Right elbow swelling  RIGHT ELBOW - COMPLETE 3+ VIEW  Comparison: None.  Findings: No definite fracture is noted.  Abnormal anterior fat pad displacement is noted suggesting underlying joint effusion.  No dislocation is noted.  Joint spaces appear otherwise intact.  IMPRESSION: Abnormal anterior fat pad displacement is noted suggesting underlying joint effusion.  No definite fracture or dislocation is noted.   Original Report Authenticated By: Lupita Raider.,  M.D.   Ct Pelvis W Contrast  07/09/2013   *RADIOLOGY REPORT*  Clinical Data:  Bleeding left buttock abscess.  CT PELVIS WITH CONTRAST  Technique:  Multidetector CT imaging of the pelvis was performed using the standard protocol following the bolus administration of intravenous contrast.  Contrast: OMNIPAQUE IOHEXOL 300 MG/ML  SOLN  Comparison:   None.  Findings:  Subcutaneous inflammation in the superficial soft tissues overlying the left gluteal  musculature present with a small foci of air present internally.  Fairly large area of inflammation measures approximately 2.2 cm in depth, 9 cm in width and 9.6 cm in craniocaudad height.  Internal density is consistent with complex fluid and the area does not show discrete areas of fluid density. No soft tissue foreign body is seen.  The rest of the pelvis is unremarkable and there is no evidence of intraperitoneal or retroperitoneal inflammation within the pelvis. Visualized bowel loops and bladder are unremarkable.  No bony abnormalities are seen.  IMPRESSION: Inflammatory process of the left buttocks in the superficial soft tissues.  This is largely an area of complex inflammation without discrete liquefied abscess.  Small foci of air are present in the area of inflammation.   There is no obvious extension into the deeper soft tissues.   Original Report Authenticated By: Irish Lack, M.D.   1. Abscess, gluteal, left   2. Hyperglycemia without ketosis   3. Uncontrolled diabetes mellitus   4. Sepsis     MDM  The patient appears reasonably stabilized for admission considering the current resources, flow, and capabilities available in the ED at this time, and I doubt any other [facility] requiring further screening and/or treatment in the ED prior to admission.  I doubt septic shock or necrotizing fasciitis.  Hurman Horn, MD 07/10/13 [phone]

## 2013-07-10 ENCOUNTER — Encounter (HOSPITAL_COMMUNITY): Payer: Self-pay | Admitting: General Surgery

## 2013-07-10 ENCOUNTER — Inpatient Hospital Stay (HOSPITAL_COMMUNITY): Payer: Medicare Other

## 2013-07-10 LAB — BASIC METABOLIC PANEL
BUN: 14 mg/dL (ref 6–23)
BUN: 14 mg/dL (ref 6–23)
CO2: 17 mEq/L — ABNORMAL LOW (ref 19–32)
CO2: 20 mEq/L (ref 19–32)
CO2: 22 mEq/L (ref 19–32)
CO2: 21 mEq/L (ref 19–32)
CO2: 21 mEq/L (ref 19–32)
Calcium: 7.8 mg/dL — ABNORMAL LOW (ref 8.4–10.5)
Calcium: 8 mg/dL — ABNORMAL LOW (ref 8.4–10.5)
Calcium: 7.6 mg/dL — ABNORMAL LOW (ref 8.4–10.5)
Chloride: 94 mEq/L — ABNORMAL LOW (ref 96–112)
Chloride: 96 mEq/L (ref 96–112)
Chloride: 98 mEq/L (ref 96–112)
Chloride: 98 mEq/L (ref 96–112)
Chloride: 97 mEq/L (ref 96–112)
Chloride: 97 mEq/L (ref 96–112)
Creatinine, Ser: 0.92 mg/dL (ref 0.50–1.35)
Creatinine, Ser: 0.95 mg/dL (ref 0.50–1.35)
Creatinine, Ser: 0.96 mg/dL (ref 0.50–1.35)
Creatinine, Ser: 0.98 mg/dL (ref 0.50–1.35)
GFR calc Af Amer: >90 mL/min (ref >90)
GFR calc Af Amer: 86 mL/min — ABNORMAL LOW (ref >90)
GFR calc Af Amer: >90 mL/min (ref >90)
GFR calc non Af Amer: 86 mL/min — ABNORMAL LOW (ref >90)
GFR calc non Af Amer: 75 mL/min — ABNORMAL LOW (ref >90)
GFR calc non Af Amer: 84 mL/min — ABNORMAL LOW (ref >90)
Glucose, Bld: 227 mg/dL — ABNORMAL HIGH (ref 70–99)
Glucose, Bld: 181 mg/dL — ABNORMAL HIGH (ref 70–99)
Glucose, Bld: 123 mg/dL — ABNORMAL HIGH (ref 70–99)
Potassium: 4 mEq/L (ref 3.5–5.1)
Potassium: 4.1 mEq/L (ref 3.5–5.1)
Potassium: 3.9 mEq/L (ref 3.5–5.1)
Potassium: 3.8 mEq/L (ref 3.5–5.1)
Potassium: 3.9 mEq/L (ref 3.5–5.1)
Sodium: 130 mEq/L — ABNORMAL LOW (ref 135–145)
Sodium: 129 mEq/L — ABNORMAL LOW (ref 135–145)
Sodium: 131 mEq/L — ABNORMAL LOW (ref 135–145)
Sodium: 132 mEq/L — ABNORMAL LOW (ref 135–145)
Sodium: 130 mEq/L — ABNORMAL LOW (ref 135–145)
Sodium: 126 mEq/L — ABNORMAL LOW (ref 135–145)

## 2013-07-10 LAB — URINE CULTURE

## 2013-07-10 LAB — CBC
HCT: 34.9 % — ABNORMAL LOW (ref 39.0–52.0)
Hemoglobin: 12 g/dL — ABNORMAL LOW (ref 13.0–17.0)
MCH: 29.9 pg (ref 26.0–34.0)
MCH: 29.2 pg (ref 26.0–34.0)
MCHC: 35.4 g/dL (ref 30.0–36.0)
MCV: 84.3 fL (ref 78.0–100.0)
Platelets: 311 10*3/uL (ref 150–400)
RBC: 4.11 MIL/uL — ABNORMAL LOW (ref 4.22–5.81)
RDW: 13.4 % (ref 11.5–15.5)

## 2013-07-10 LAB — GLUCOSE, CAPILLARY
Glucose-Capillary: 168 mg/dL — ABNORMAL HIGH (ref 70–99)
Glucose-Capillary: 188 mg/dL — ABNORMAL HIGH (ref 70–99)
Glucose-Capillary: 200 mg/dL — ABNORMAL HIGH (ref 70–99)
Glucose-Capillary: 215 mg/dL — ABNORMAL HIGH (ref 70–99)
Glucose-Capillary: 268 mg/dL — ABNORMAL HIGH (ref 70–99)
Glucose-Capillary: 287 mg/dL — ABNORMAL HIGH (ref 70–99)
Glucose-Capillary: 370 mg/dL — ABNORMAL HIGH (ref 70–99)

## 2013-07-10 MED ORDER — SODIUM CHLORIDE 0.9 % IV SOLN
INTRAVENOUS | Status: DC
  Administered 2013-07-10 – 2013-07-14 (×8): via INTRAVENOUS

## 2013-07-10 MED ORDER — INSULIN GLARGINE 100 UNIT/ML ~~LOC~~ SOLN
10.0000 [IU] | Freq: Every day | SUBCUTANEOUS | Status: DC
  Administered 2013-07-10 – 2013-07-13 (×4): 10 [IU] via SUBCUTANEOUS
  Filled 2013-07-10 (×7): qty 0.1

## 2013-07-10 MED ORDER — INSULIN GLARGINE 100 UNIT/ML ~~LOC~~ SOLN
15.0000 [IU] | Freq: Every day | SUBCUTANEOUS | Status: DC
  Filled 2013-07-10: qty 0.15

## 2013-07-10 MED ORDER — INSULIN ASPART 100 UNIT/ML ~~LOC~~ SOLN
0.0000 [IU] | Freq: Three times a day (TID) | SUBCUTANEOUS | Status: DC
  Administered 2013-07-11 (×2): 3 [IU] via SUBCUTANEOUS
  Administered 2013-07-11: 5 [IU] via SUBCUTANEOUS
  Administered 2013-07-12: 08:00:00 via SUBCUTANEOUS
  Administered 2013-07-12: 3 [IU] via SUBCUTANEOUS
  Administered 2013-07-12: 5 [IU] via SUBCUTANEOUS
  Administered 2013-07-13 (×2): 3 [IU] via SUBCUTANEOUS
  Administered 2013-07-13: 5 [IU] via SUBCUTANEOUS
  Administered 2013-07-14: 2 [IU] via SUBCUTANEOUS
  Administered 2013-07-14: 3 [IU] via SUBCUTANEOUS

## 2013-07-10 MED ORDER — DEXTROSE 50 % IV SOLN: 25.0000 mL | INTRAVENOUS | Status: DC | PRN

## 2013-07-10 MED ORDER — SODIUM CHLORIDE 0.9 % IV SOLN
INTRAVENOUS | Status: DC
  Administered 2013-07-10: 2.1 [IU]/h via INTRAVENOUS
  Administered 2013-07-10: 3.1 [IU]/h via INTRAVENOUS
  Filled 2013-07-10: qty 1

## 2013-07-10 MED ORDER — DEXTROSE-NACL 5-0.45 % IV SOLN
INTRAVENOUS | Status: DC
  Administered 2013-07-10: 100 mL/h via INTRAVENOUS

## 2013-07-10 MED ORDER — POLYETHYLENE GLYCOL 3350 17 G PO PACK
17.0000 g | PACK | Freq: Two times a day (BID) | ORAL | Status: DC
  Administered 2013-07-10 – 2013-07-13 (×7): 17 g via ORAL
  Filled 2013-07-10 (×10): qty 1

## 2013-07-10 MED ORDER — SENNOSIDES-DOCUSATE SODIUM 8.6-50 MG PO TABS
1.0000 | ORAL_TABLET | Freq: Two times a day (BID) | ORAL | Status: DC
  Administered 2013-07-10 – 2013-07-13 (×7): 1 via ORAL
  Filled 2013-07-10 (×10): qty 1

## 2013-07-10 MED ORDER — POTASSIUM CHLORIDE 10 MEQ/100ML IV SOLN
10.0000 meq | INTRAVENOUS | Status: AC
  Administered 2013-07-10 (×2): 10 meq via INTRAVENOUS
  Filled 2013-07-10 (×2): qty 100

## 2013-07-10 NOTE — Progress Notes (Signed)
A gritty with the assessment and treatment plan outlined by Doristine Mango PA. See my complete assessment and progress note from earlier today.   Angelia Mould. Derrell Lolling, M.D., [facility] General and Minimally invasive Surgery Breast and Colorectal Surgery Office:   [phone] Pager:   [phone]

## 2013-07-10 NOTE — Progress Notes (Signed)
Pt refuses diabetes nutrition education at this time, stating he does not feel well. RD will re-attempt diet education tomorrow. Ian Malkin RD, LDN

## 2013-07-10 NOTE — Progress Notes (Signed)
Inpatient Diabetes Program Recommendations  AACE/ADA: New Consensus Statement on Inpatient Glycemic Control (2013)  Target Ranges:  Prepandial:   less than 140 mg/dL      Peak postprandial:   less than 180 mg/dL (1-2 hours)      Critically ill patients:  140 - 180 mg/dL   Reason for Visit: Diabetes Coordinator met with patient to discuss new onset DM.  Patient said he did not feel well and did not want to talk about it right now. Will continue to follow and attempt to discuss with patient tomorrow.   Thank you  Piedad Climes BSN, RN,CDE Inpatient Diabetes Coordinator [phone] (team pager)

## 2013-07-10 NOTE — Progress Notes (Signed)
[facility] PROGRESS NOTE  [name] [name] ZOX:[id number] DOB: [birth year] DOA: 07/08/2013 PCP: No primary provider on file.  Assessment/Plan: 1. Left Gluteal Abscess:  Continue with vancomycin and zosyn day 2. Surgery consulted. CT scan with no definite abscess. Patient S/P I and D by surgery 7-29. WBC still elevated.  2. HONK, New diagnose of DM: On admission blood sugar more than 700, gap at 10. He was started on insulin Gtt. HB A1c 12.  B-met at 3 am with bicarb at 18, gap 15. Will repeat B-met might need to resume insulin Gtt if gap increased.  3. Right elbow redness, swelling:  x ray with possible effusion. Will continue with IV antibiotics. I have ask ortho to check elbow again due to persistent leukocytosis.  4. Early sepsis: Patient presents with leukocytosis, tachycardia, fever, source of infection gluteal abscess, elbow cellulitis. Continue with antibiotics. Monitor BP. Blood culture no growth.   5. Increase Alk phosphatase: Trending down. Monitor.  6. Hyponatremia// Pseudo-hyponatremia: secondary to hyperglycemia, and secondary to decrease volume. Improving. Continue with IV fluids.  7. Constipation: Will start miralax, docusate.  8. Leukocytosis: gluteal abscess appears clean. Will ask ortho to see again for elbow septic joint. I will order chest x-ray. UA negative for infection.   Code Status: Full Code.  Family Communication: Care discussed with the patient.  Disposition Plan: remain step down.    Consultants:  [facility].   Procedures:  I and D in the ED 7-27  Antibiotics:  Vancomycin 7-27  Zosyn 7-27  HPI/Subjective: Still complaining of pain in the right elbow.  No BM. No others complaints.   Objective: Filed Vitals:   07/10/13 0400 07/10/13 0500 07/10/13 0600 07/10/13 0700  BP: 121/61 124/58 120/62 112/65  Pulse: 105 103 96 98  Temp: 99.2 F (37.3 C)     TempSrc: Axillary     Resp: 25 24 23 22   Height:      Weight:      SpO2: 95% 94% 96% 96%     Intake/Output Summary (Last 24 hours) at 07/10/13 0753 Last data filed at 07/10/13 0600  Gross per 24 hour  Intake 4627.5 ml  Output   1250 ml  Net 3377.5 ml   Filed Weights   07/08/13 2135  Weight: 77.111 kg (170 lb)    Exam:   General:  No distress.   Cardiovascular: S 1, S 2 RRR  Respiratory: CTA  Abdomen: not evaluated.  Musculoskeletal: left gluteal muscle with 3 cm open wound, with blood, and necrotic area. Right elbow with redness, swelling.   Data Reviewed: Basic Metabolic Panel:  Recent Labs Lab 07/08/13 2152 07/08/13 2200 07/09/13 0220 07/09/13 0530 07/09/13 0755 07/10/13 0334  NA 121* 125* 124* 130* 131* 130*  K 4.1 4.1 3.7 3.8 4.1 3.9  CL 86* 92* 92* 96 98 97  CO2 22  --  21 23 23  18*  GLUCOSE 680* >700* 398* 136* 92 304*  BUN 15 15 10 9 9 12   CREATININE 0.69 0.90 0.64 0.64 0.58 0.92  CALCIUM 9.0  --  8.3* 8.8 8.5 7.8*   Liver Function Tests:  Recent Labs Lab 07/08/13 2152 07/09/13 0530  AST 17 20  ALT 16 14  ALKPHOS 157* 131*  BILITOT 0.3 0.4  PROT 6.4 6.1  ALBUMIN 2.5* 2.3*   No results found for this basename: LIPASE, AMYLASE,  in the last 168 hours No results found for this basename: AMMONIA,  in the last 168 hours CBC:  Recent Labs Lab  07/08/13 2152 07/08/13 2200 07/09/13 0220 07/09/13 0530 07/10/13 0334  WBC 20.1*  --  19.4* 21.6* 21.4*  NEUTROABS 17.9*  --   --   --   --   HGB 13.8 14.6 13.0 13.6 11.8*  HCT 39.8 43.0 36.8* 38.3* 33.3*  MCV 82.7  --  81.8 82.2 84.3  PLT 344  --  323 331 311   Cardiac Enzymes: No results found for this basename: CKTOTAL, CKMB, CKMBINDEX, TROPONINI,  in the last 168 hours BNP (last 3 results) No results found for this basename: PROBNP,  in the last 8760 hours CBG:  Recent Labs Lab 07/09/13 1438 07/09/13 1657 07/09/13 1932 07/09/13 2352 07/10/13 0334  GLUCAP 239* 233* 253* 287* 292*    Recent Results (from the past 240 hour(s))  CULTURE, BLOOD (ROUTINE X 2)     Status:  None   Collection Time    07/08/13 11:50 PM      Result Value Range Status   Specimen Description BLOOD LEFT FEMORAL ARTERY   Final   Special Requests BOTTLES DRAWN AEROBIC ONLY   Final   Culture  Setup Time 07/09/2013 10:38   Final   Culture     Final   Value:        BLOOD CULTURE RECEIVED NO GROWTH TO DATE CULTURE WILL BE HELD FOR 5 DAYS BEFORE ISSUING A FINAL NEGATIVE REPORT   Report Status PENDING   Incomplete  CULTURE, BLOOD (ROUTINE X 2)     Status: None   Collection Time    07/09/13 12:10 AM      Result Value Range Status   Specimen Description BLOOD LEFT ARM   Final   Special Requests BOTTLES DRAWN AEROBIC AND ANAEROBIC   Final   Culture  Setup Time 07/09/2013 10:42   Final   Culture     Final   Value:        BLOOD CULTURE RECEIVED NO GROWTH TO DATE CULTURE WILL BE HELD FOR 5 DAYS BEFORE ISSUING A FINAL NEGATIVE REPORT   Report Status PENDING   Incomplete  MRSA PCR SCREENING     Status: Abnormal   Collection Time    07/09/13  1:30 AM      Result Value Range Status   MRSA by PCR POSITIVE (*) NEGATIVE Final   Comment:            The GeneXpert MRSA Assay (FDA     approved for NASAL specimens     only), is one component of a     comprehensive MRSA colonization     surveillance program. It is not     intended to diagnose MRSA     infection nor to guide or     monitor treatment for     MRSA infections.     RESULT CALLED TO, READ BACK BY AND VERIFIED WITH:     STERN,R/2W @0429  ON 07/09/13 BY KARCZEWSKI,S.     Studies: Dg Elbow Complete Right  07/09/2013   *RADIOLOGY REPORT*  Clinical Data: Right elbow swelling  RIGHT ELBOW - COMPLETE 3+ VIEW  Comparison: None.  Findings: No definite fracture is noted.  Abnormal anterior fat pad displacement is noted suggesting underlying joint effusion.  No dislocation is noted.  Joint spaces appear otherwise intact.  IMPRESSION: Abnormal anterior fat pad displacement is noted suggesting underlying joint effusion.  No definite  fracture or dislocation is noted.   Original Report Authenticated By: Lupita Raider.,  M.D.   Ct  Pelvis W Contrast  07/09/2013   *RADIOLOGY REPORT*  Clinical Data:  Bleeding left buttock abscess.  CT PELVIS WITH CONTRAST  Technique:  Multidetector CT imaging of the pelvis was performed using the standard protocol following the bolus administration of intravenous contrast.  Contrast: OMNIPAQUE IOHEXOL 300 MG/ML  SOLN  Comparison:   None.  Findings:  Subcutaneous inflammation in the superficial soft tissues overlying the left gluteal musculature present with a small foci of air present internally.  Fairly large area of inflammation measures approximately 2.2 cm in depth, 9 cm in width and 9.6 cm in craniocaudad height.  Internal density is consistent with complex fluid and the area does not show discrete areas of fluid density. No soft tissue foreign body is seen.  The rest of the pelvis is unremarkable and there is no evidence of intraperitoneal or retroperitoneal inflammation within the pelvis. Visualized bowel loops and bladder are unremarkable.  No bony abnormalities are seen.  IMPRESSION: Inflammatory process of the left buttocks in the superficial soft tissues.  This is largely an area of complex inflammation without discrete liquefied abscess.  Small foci of air are present in the area of inflammation.  There is no obvious extension into the deeper soft tissues.   Original Report Authenticated By: Irish Lack, M.D.    Scheduled Meds: . Chlorhexidine Gluconate Cloth  6 each Topical Q0600  . heparin  5,000 Units Subcutaneous Q8H  . insulin aspart  0-9 Units Subcutaneous TID WC  . insulin glargine  15 Units Subcutaneous Daily  . mupirocin ointment  1 application Nasal BID  . piperacillin-tazobactam (ZOSYN)  IV  3.375 g Intravenous Q8H  . vancomycin  750 mg Intravenous Q8H   Continuous Infusions: . sodium chloride 125 mL/hr at 07/09/13 0800  . sodium chloride 125 mL/hr at 07/09/13 1816     Principal Problem:   Sepsis Active Problems:   Hyperosmolar (nonketotic) coma   Abscess, gluteal, right   Tachycardia    Time spent: 35 minutes.     Rafeal Skibicki  [facility] Pager [phone]. If 7PM-7AM, please contact night-coverage at [email or web address], password [employer] 07/10/2013, 7:53 AM  LOS: 2 days

## 2013-07-10 NOTE — Progress Notes (Addendum)
Patient ID: [name] [name], male   DOB: [birth year], 67 y.o.   MRN: [id number] Surgery Note:  Wound check: Dressing removed, there is only a min amount of remaining necrotic tissue, edges are clean and no significant erythema, healthy bleeding on skin edges and base of wound, no odor Wet to dry dressing replaced  Impression: WOund is very clean and healthy, really does not appear to be a source for his leukocytosis, concerned that the elbow may be an underlying source.  Ortho has been contacted again by the attending team and they will come re-evaluate his elbow.    Dressing changes bid with wet to dry Will follow  Zaliyah Meikle 8:12 AM 07/10/2013

## 2013-07-10 NOTE — Op Note (Signed)
Patient Name:           [name] [name]   Date of Surgery:        07/09/2013  Pre op Diagnosis:      Complex soft-tissue infection left gluteal area  Post op Diagnosis:    Same  Procedure:                 Debridement of skin, subcutaneous tissue, and muscle fascia left gluteal area, 8 cm x 8 cm area, 64 cm  Surgeon:                     Angelia Mould. Derrell Lolling, M.D., FACS  Assistant:                      None  Operative Indications:   This is a  67 year old man who presented to the emergency department one day prior to surgery with a one-week history of left buttock discomfort, bleeding, fever and chills and poor appetite. Attempt to drain this was performed by the ED physician but was incomplete. Blood sugars were in the 700s. He had no prior history of medical problems but was not followed by a primary care physician.He was admitted to the step down unit for control of sepsis and hyperglycemia. We were called to evaluate the patient the following morning and have a 7 cm circular diameter area of necrotic skin subcutaneous tissue and purulent drainage. CT showed this to be localized to the soft tissues. There was no evidence of perirectal abscess. He is brought to the operating room urgently for debridement and irrigation.  Operative Findings:       The patient had tissue necrosis and infection of the skin, subcutaneous tissue and muscle fascia. The gluteal muscle beneath was soft and healthy. It bled well. There were gangrenous changes at the periphery of the wound of the skin and dermis. This required debridement of an 8 cm diameter area, approximately 64 cm.  Procedure in Detail:          Following the induction of general endotracheal anesthesia the patient was placed in a prone, slight jackknife position on the operating table. The lower back gluteal areas and posterior perineum were prepped and draped in a sterile fashion. Surgical time out was performed. Using knife and cautery I debrided all the  necrotic infected tissue as described above. This but it bled freely and a significant amount of time was spent controlling hemostasis. It was irrigated. The end of the case all the tissues looked healthy. After irrigating with extensively retracted with saline moistened Kerlix. There was no bleeding. He was returned to the PACU in stable condition. EBL 40 cc. Counts correct. Complications none.     Angelia Mould. Derrell Lolling, M.D., FACS General and Minimally Invasive Surgery Breast and Colorectal Surgery  07/10/2013 5:23 AM

## 2013-07-10 NOTE — Progress Notes (Signed)
ANTIBIOTIC CONSULT NOTE - FOLLOW UP  Pharmacy Consult for Vancomycin, Zosyn Indication: gluteal abscess, r/o elbow septic joint  No Known Allergies  Patient Measurements: Height: 5\' 11"  (180.3 cm) Weight: 170 lb (77.111 kg) IBW/kg (Calculated) : 75.3  Vital Signs: Temp: 99.2 F (37.3 C) (07/29 0400) Temp src: Axillary (07/29 0400) BP: 120/59 mmHg (07/29 0800) Pulse Rate: 101 (07/29 0800)  Labs:  Recent Labs  07/09/13 0220 07/09/13 0530 07/09/13 0755 07/10/13 0334  WBC 19.4* 21.6*  --  21.4*  HGB 13.0 13.6  --  11.8*  PLT 323 331  --  311  CREATININE 0.64 0.64 0.58 0.92   Estimated Creatinine Clearance: 84.1 ml/min (by C-G formula based on Cr of 0.92). No results found for this basename: VANCOTROUGH, VANCOPEAK, VANCORANDOM, GENTTROUGH, GENTPEAK, GENTRANDOM, TOBRATROUGH, TOBRAPEAK, TOBRARND, AMIKACINPEAK, AMIKACINTROU, AMIKACIN,  in the last 72 hours    Assessment: 27 yom with no significant PMH presented 7/27 with bleeding L gluteal abscess. I&D of abscess performed in the ED and patient also noted to have random blood sugar in the 700s.   Today is D#2 Vancomycin and Zosyn.  Patient went to the OR yesterday for further debridement by surgery. Tmax 101, WBC same at 21.4K, Scr up slightly for CG CrCl of 84 m/min and normalized CrCl of 80 ml/min.  Blood and urine cultures pending.  MRSA PCR positive.   Patient now diagnosed with new-onset diabetes, CBGs not controlled yet - currently on SSI and Lantus  Pt now with redness/swelling on R elbow, possible bursitis. Elbow X-ray with possible underlying joint effusion. Ortho on board and will re-evaluate.  Per surgery, gluteal wound is clean and does not appear to be source for leukocytosis/fever so now maybe be an elbow issue. Scr up since abx started.    Goal of Therapy:  Vancomycin trough level 15-20 mcg/ml  Plan:   Continue Vancomycin 750 mg IV q8h  Vancomycin trough prior to 1700 dose today  Continue Zosyn 3.375 gm IV  q8h  Pharmacy will f/u  Geoffry Paradise, PharmD, BCPS Pager: [phone] 9:07 AM Pharmacy #: [id number]

## 2013-07-10 NOTE — Progress Notes (Addendum)
ANTIBIOTIC CONSULT NOTE - FOLLOW UP  Pharmacy Consult for Vancomycin, Zosyn Indication: gluteal abscess, r/o elbow septic joint  No Known Allergies  Patient Measurements: Height: 5\' 11"  (180.3 cm) Weight: 170 lb (77.111 kg) IBW/kg (Calculated) : 75.3  Vital Signs: Temp: 99.6 F (37.6 C) (07/29 1600) Temp src: Oral (07/29 1600) BP: 129/56 mmHg (07/29 1600) Pulse Rate: 109 (07/29 1600)  Labs:  Recent Labs  07/09/13 0530  07/10/13 0334  07/10/13 1025 07/10/13 1205 07/10/13 1334 07/10/13 1600  WBC 21.6*  --  21.4*  --  21.8*  --   --   --   HGB 13.6  --  11.8*  --  12.0*  --   --   --   PLT 331  --  311  --  308  --   --   --   CREATININE 0.64  < > 0.92  < > 1.02 0.96 0.98 0.98  < > = values in this interval not displayed. Estimated Creatinine Clearance: 79 ml/min (by C-G formula based on Cr of 0.98).  Recent Labs  07/10/13 1601  VANCOTROUGH 10.6      Assessment: 66 yom with no significant PMH presented 7/27 with bleeding L gluteal abscess. I&D of abscess performed in the ED and patient also noted to have random blood sugar in the 700s.   Today is D#2 Vancomycin and Zosyn.  Patient went to the OR yesterday for further debridement by surgery. Tmax 101, WBC same at 21.4K, Scr up slightly for CG CrCl of 84 m/min and normalized CrCl of 80 ml/min.  Blood and urine cultures pending.  MRSA PCR positive.   Patient now diagnosed with new-onset diabetes, CBGs not controlled yet - currently on SSI and Lantus  Pt now with redness/swelling on R elbow, possible bursitis. Elbow X-ray with possible underlying joint effusion. Ortho on board and will re-evaluate.  Per surgery, gluteal wound is clean and does not appear to be source for leukocytosis/fever so now maybe be an elbow issue. Scr up since abx started.    Goal of Therapy:  Vancomycin trough level 15-20 mcg/ml  7/29: VT =  10.6 Mcg/ml on 750mg  IV q8h (prior to 6th dose)  Plan:   Continue Vancomycin 750 mg IV q8h,  vancomycin trough = 10.36mcg/ml which is likely sufficient for skin/soft tissue infection being treated.  I will not increase dose as I  expect the trough to rise with further dosing, especially due to patient's age.    Consider rechecking trough in 2-3 days if clinically appropriate to evaluate for drug accumulation or if infection not improving.   Juliette Alcide, PharmD, BCPS.   Pager: [phone] 07/10/2013 4:49 PM

## 2013-07-10 NOTE — Progress Notes (Signed)
1 Day Post-Op  Subjective: Stable and alert. Pain well-controlled. No bleeding. Constipated.101 at midnight. 992 currently. Heart rate 103.  WBC 21,000. Hemoglobin 11.8. Glucose 304.  Objective: Vital signs in last 24 hours: Temp:  [97.8 F (36.6 C)-101 F (38.3 C)] 99.2 F (37.3 C) (07/29 0400) Pulse Rate:  [96-125] 96 (07/29 0600) Resp:  [0-31] 23 (07/29 0600) BP: (99-139)/(56-101) 120/62 mmHg (07/29 0600) SpO2:  [90 %-100 %] 96 % (07/29 0600) Last BM Date: 07/07/13  Intake/Output from previous day: 07/28 0701 - 07/29 0700 In: 4627.5 [P.O.:315; I.V.:3950; IV Piggyback:362.5] Out: 1250 [Urine:1250] Intake/Output this shift: Total I/O In: 1450 [I.V.:1250; IV Piggyback:200] Out: 1075 [Urine:1075]  General appearance: alert. In no distress. Oriented. Does not appear toxic. GI: soft, non-tender; bowel sounds normal; no masses,  no organomegaly Skin: Skin color, texture, turgor normal. No rashes or lesions or left gluteal bandage clean and dry. No evidence of bleeding.  Lab Results:  Results for orders placed during the hospital encounter of 07/08/13 (from the past 24 hour(s))  GLUCOSE, CAPILLARY     Status: Abnormal   Collection Time    07/09/13  6:38 AM      Result Value Range   Glucose-Capillary 203 (*) 70 - 99 mg/dL  GLUCOSE, CAPILLARY     Status: Abnormal   Collection Time    07/09/13  7:45 AM      Result Value Range   Glucose-Capillary 106 (*) 70 - 99 mg/dL   Comment 1 Documented in Chart     Comment 2 Notify RN    BASIC METABOLIC PANEL     Status: Abnormal   Collection Time    07/09/13  7:55 AM      Result Value Range   Sodium 131 (*) 135 - 145 mEq/L   Potassium 4.1  3.5 - 5.1 mEq/L   Chloride 98  96 - 112 mEq/L   CO2 23  19 - 32 mEq/L   Glucose, Bld 92  70 - 99 mg/dL   BUN 9  6 - 23 mg/dL   Creatinine, Ser 2.13  0.50 - 1.35 mg/dL   Calcium 8.5  8.4 - 08.6 mg/dL   GFR calc non Af Amer >90  >90 mL/min   GFR calc Af Amer >90  >90 mL/min  GLUCOSE, CAPILLARY      Status: Abnormal   Collection Time    07/09/13 11:11 AM      Result Value Range   Glucose-Capillary 239 (*) 70 - 99 mg/dL   Comment 1 Documented in Chart     Comment 2 Notify RN    GLUCOSE, CAPILLARY     Status: Abnormal   Collection Time    07/09/13  1:19 PM      Result Value Range   Glucose-Capillary 269 (*) 70 - 99 mg/dL   Comment 1 Notify RN     Comment 2 Documented in Chart    GLUCOSE, CAPILLARY     Status: Abnormal   Collection Time    07/09/13  4:57 PM      Result Value Range   Glucose-Capillary 233 (*) 70 - 99 mg/dL   Comment 1 Documented in Chart     Comment 2 Notify RN    GLUCOSE, CAPILLARY     Status: Abnormal   Collection Time    07/09/13  7:32 PM      Result Value Range   Glucose-Capillary 253 (*) 70 - 99 mg/dL  GLUCOSE, CAPILLARY     Status: Abnormal  Collection Time    07/09/13 11:52 PM      Result Value Range   Glucose-Capillary 287 (*) 70 - 99 mg/dL   Comment 1 Notify RN    CBC     Status: Abnormal   Collection Time    07/10/13  3:34 AM      Result Value Range   WBC 21.4 (*) 4.0 - 10.5 K/uL   RBC 3.95 (*) 4.22 - 5.81 MIL/uL   Hemoglobin 11.8 (*) 13.0 - 17.0 g/dL   HCT 16.1 (*) 09.6 - 04.5 %   MCV 84.3  78.0 - 100.0 fL   MCH 29.9  26.0 - 34.0 pg   MCHC 35.4  30.0 - 36.0 g/dL   RDW 40.9  81.1 - 91.4 %   Platelets 311  150 - 400 K/uL  BASIC METABOLIC PANEL     Status: Abnormal   Collection Time    07/10/13  3:34 AM      Result Value Range   Sodium 130 (*) 135 - 145 mEq/L   Potassium 3.9  3.5 - 5.1 mEq/L   Chloride 97  96 - 112 mEq/L   CO2 18 (*) 19 - 32 mEq/L   Glucose, Bld 304 (*) 70 - 99 mg/dL   BUN 12  6 - 23 mg/dL   Creatinine, Ser 7.82  0.50 - 1.35 mg/dL   Calcium 7.8 (*) 8.4 - 10.5 mg/dL   GFR calc non Af Amer 86 (*) >90 mL/min   GFR calc Af Amer >90  >90 mL/min  GLUCOSE, CAPILLARY     Status: Abnormal   Collection Time    07/10/13  3:34 AM      Result Value Range   Glucose-Capillary 292 (*) 70 - 99 mg/dL   Comment 1 Notify RN        Studies/Results: @RISRSLT24 @  . Chlorhexidine Gluconate Cloth  6 each Topical Q0600  . heparin  5,000 Units Subcutaneous Q8H  . insulin aspart  0-9 Units Subcutaneous TID WC  . mupirocin ointment  1 application Nasal BID  . piperacillin-tazobactam (ZOSYN)  IV  3.375 g Intravenous Q8H  . vancomycin  750 mg Intravenous Q8H     Assessment/Plan: s/p Procedure(s): IRRIGATION AND DEBRIDEMENT ABSCESS LEFT BUTTOCKS  POD #1. Debridement of skin, subcutaneous tissue, muscle fascia left gluteal area. Anticipate soft tissue infection will be well-controlled by this surgery. Begin twice a day dressing changes today Watch wound carefully.  Continued leukocytosis of some concern. Seems out of proportion to wound findings. Follow closely. Hyperglycemia. Still poorly controlled.  @PROBHOSP @  LOS: 2 days    Nivaan Dicenzo M 07/10/2013  . .prob

## 2013-07-10 NOTE — Progress Notes (Signed)
Orthopaedic Trauma Service  Subjective: Doing ok this am Surgery yesterday for L gluteal abscess No fever or chills  R arm sore but moving around Has taken ace wrap off   Objective: Vital signs in last 24 hours: Temp:  [97.8 F (36.6 C)-101 F (38.3 C)] 99.2 F (37.3 C) (07/29 0400) Pulse Rate:  [96-125] 101 (07/29 0800) Resp:  [17-31] 25 (07/29 0800) BP: (99-139)/(56-101) 120/59 mmHg (07/29 0800) SpO2:  [90 %-100 %] 97 % (07/29 0800)  Intake/Output from previous day: 07/28 0701 - 07/29 0700 In: 4627.5 [P.O.:315; I.V.:3950; IV Piggyback:362.5] Out: 1250 [Urine:1250] Intake/Output this shift:     Recent Labs  07/08/13 2152 07/08/13 2200 07/09/13 0220 07/09/13 0530 07/10/13 0334  HGB 13.8 14.6 13.0 13.6 11.8*    Recent Labs  07/09/13 0530 07/10/13 0334  WBC 21.6* 21.4*  RBC 4.66 3.95*  HCT 38.3* 33.3*  PLT 331 311    Recent Labs  07/10/13 0334 07/10/13 0825  NA 130* 127*  K 3.9 4.0  CL 97 94*  CO2 18* 17*  BUN 12 14  CREATININE 0.92 0.95  GLUCOSE 304* 286*  CALCIUM 7.8* 7.6*   No results found for this basename: LABPT, INR,  in the last 72 hours  Phyical Exam  Gen: awake and alert, NAD  Ext:      Right upper Extremity     Ace wrap off  Pt lying directly on R elbow  There is still some erythema present  Does not appear to be any worse that it was yesterday             No appreciable fluid collection noted over olecranon             Pt with good active and passive elbow motion.  Able to move elbow w/o significant pain  Ext warm, + Radial pulse  Assessment/Plan:  67 year old male with new diagnosis of diabetes with left gluteal abscess and right elbow swelling, possible bursitis  1. R elbow swelling/ cellulitis  Clinically it appear to be more of a cellulitis than septic bursitis   Clinically no worse than yesterday  Continue with current abx and adjust to cultures  Activity as tolerated with R arm  ROM as tolerated R elbow  Ace  wrap for comfort  At the current time there does not appear to be any fluid collection the surgery could address   Suspect elevated WBC count due to recent surgery, gluteal abscess, R elbow cellulitis and HHS   Follow WBC trend including differential  Will follow R elbow   2. Continue per medicine and gen surgery  3. ID  vanc  Zosyn  4. HHS  Optimize sugar control per IM  Will follow along   Mearl Latin, PA-C [facility] [phone] (P) 07/10/2013, 9:30 AM

## 2013-07-11 ENCOUNTER — Inpatient Hospital Stay (HOSPITAL_COMMUNITY): Payer: Medicare Other

## 2013-07-11 DIAGNOSIS — E1101 Type 2 diabetes mellitus with hyperosmolarity with coma: Secondary | ICD-10-CM

## 2013-07-11 LAB — GLUCOSE, CAPILLARY
Glucose-Capillary: 119 mg/dL — ABNORMAL HIGH (ref 70–99)
Glucose-Capillary: 133 mg/dL — ABNORMAL HIGH (ref 70–99)
Glucose-Capillary: 150 mg/dL — ABNORMAL HIGH (ref 70–99)
Glucose-Capillary: 182 mg/dL — ABNORMAL HIGH (ref 70–99)
Glucose-Capillary: 183 mg/dL — ABNORMAL HIGH (ref 70–99)
Glucose-Capillary: 183 mg/dL — ABNORMAL HIGH (ref 70–99)
Glucose-Capillary: 194 mg/dL — ABNORMAL HIGH (ref 70–99)
Glucose-Capillary: 214 mg/dL — ABNORMAL HIGH (ref 70–99)

## 2013-07-11 LAB — URINALYSIS, ROUTINE W REFLEX MICROSCOPIC
Bilirubin Urine: NEGATIVE
Glucose, UA: >1000 mg/dL — AB
Ketones, ur: 40 mg/dL — AB
Leukocytes, UA: NEGATIVE
Protein, ur: 30 mg/dL — AB
pH: 5.5 (ref 5.0–8.0)

## 2013-07-11 LAB — BASIC METABOLIC PANEL
BUN: 15 mg/dL (ref 6–23)
BUN: 15 mg/dL (ref 6–23)
Chloride: 95 mEq/L — ABNORMAL LOW (ref 96–112)
GFR calc non Af Amer: 75 mL/min — ABNORMAL LOW (ref >90)
Glucose, Bld: 169 mg/dL — ABNORMAL HIGH (ref 70–99)
Glucose, Bld: 176 mg/dL — ABNORMAL HIGH (ref 70–99)
Potassium: 3.4 mEq/L — ABNORMAL LOW (ref 3.5–5.1)
Potassium: 3.7 mEq/L (ref 3.5–5.1)

## 2013-07-11 LAB — CBC WITH DIFFERENTIAL/PLATELET
Basophils Relative: 0 % (ref 0–1)
Eosinophils Absolute: 0.2 10*3/uL (ref 0.0–0.7)
Eosinophils Relative: 1 % (ref 0–5)
HCT: 39.3 % (ref 39.0–52.0)
Hemoglobin: 13.2 g/dL (ref 13.0–17.0)
MCH: 28.8 pg (ref 26.0–34.0)
MCHC: 33.6 g/dL (ref 30.0–36.0)
Monocytes Absolute: 1.4 10*3/uL — ABNORMAL HIGH (ref 0.1–1.0)
Monocytes Relative: 6 % (ref 3–12)

## 2013-07-11 LAB — URINE MICROSCOPIC-ADD ON

## 2013-07-11 MED ORDER — DIPHENHYDRAMINE HCL 50 MG/ML IJ SOLN
50.0000 mg | Freq: Once | INTRAMUSCULAR | Status: AC
  Administered 2013-07-11: 50 mg via INTRAVENOUS
  Filled 2013-07-11: qty 1

## 2013-07-11 MED ORDER — INSULIN ASPART 100 UNIT/ML ~~LOC~~ SOLN
3.0000 [IU] | Freq: Once | SUBCUTANEOUS | Status: AC
  Administered 2013-07-11: 3 [IU] via SUBCUTANEOUS

## 2013-07-11 MED ORDER — DIPHENHYDRAMINE HCL 25 MG PO CAPS
ORAL_CAPSULE | ORAL | Status: AC
  Filled 2013-07-11: qty 2

## 2013-07-11 NOTE — Progress Notes (Signed)
[facility] PROGRESS NOTE  [name] [name] BMW:[id number] DOB: 1946-09-17 DOA: 07/08/2013 PCP: No primary provider on file.  Assessment/Plan: Left Gluteal Abscess -Continue with vanc/zosyn day 3. -S/p I and D of abscess 7/29.  Right Elbow Cellulitis -Seen by ortho. -They do not believe this is septic arthritis, but more of a cellulitis. -Improved today per ortho report. -Plan to continue current abx.  Sepsis -Presumed 2/2 above. -All cx data remains negative to date. -Given persistent fever/leukocytosis, will recheck blood cx, U/A, urine cx and CXR. -CT abd/pelvis 7/28 negative except for gluteal abscess findings.  HONK -Resolved. -CBGs with fair control. -New DM diagnosis.  Hyponatremia -Follow. -Initially thought to be false related to hyperglycemia.  Leukocytosis -Persistent. -See above for details.  Code Status: Full code Family Communication: Patient only  Disposition Plan: Move to floor today.   Consultants:  Ortho  Gen Surgery   Antibiotics:  Vanc  Zosyn   Subjective: No complaints other than continued right arm pain.  Objective: Filed Vitals:   07/11/13 0500 07/11/13 0600 07/11/13 0700 07/11/13 0800  BP:   140/68 137/74  Pulse: 97 100 107 100  Temp:    100.9 F (38.3 C)  TempSrc:    Axillary  Resp: 32 17 18 17   Height:      Weight:      SpO2: 98% 100% 98% 96%    Intake/Output Summary (Last 24 hours) at 07/11/13 1116 Last data filed at 07/11/13 0700  Gross per 24 hour  Intake 2039.47 ml  Output    850 ml  Net 1189.47 ml   Filed Weights   07/08/13 2135  Weight: 77.111 kg (170 lb)    Exam:   General:  AA Ox3  Cardiovascular: RRR, no M/R/G  Respiratory: CTA B  Abdomen: S/NT/ND/+BS/no masses or organomegaly noted.  Extremities: Right elbow in ACE wrap, no C/C/E/+pedal pulses   Neurologic:  Grossly intact and nonfocal.  Data Reviewed: Basic Metabolic Panel:  Recent Labs Lab 07/10/13 1600 07/10/13 1939  07/10/13 2102 07/10/13 2330 07/11/13 0337  NA 130* 127* 126* 127* 125*  K 3.9 3.6 3.7 3.7 3.4*  CL 97 97 95* 96 95*  CO2 22 21 21 21 21   GLUCOSE 181* 132* 123* 169* 176*  BUN 14 14 14 15 15   CREATININE 0.98 0.98 0.95 1.02 0.99  CALCIUM 8.0* 7.7* 7.6* 7.8* 7.9*   Liver Function Tests:  Recent Labs Lab 07/08/13 2152 07/09/13 0530  AST 17 20  ALT 16 14  ALKPHOS 157* 131*  BILITOT 0.3 0.4  PROT 6.4 6.1  ALBUMIN 2.5* 2.3*   No results found for this basename: LIPASE, AMYLASE,  in the last 168 hours No results found for this basename: AMMONIA,  in the last 168 hours CBC:  Recent Labs Lab 07/08/13 2152  07/09/13 0220 07/09/13 0530 07/10/13 0334 07/10/13 1025 07/11/13 0337  WBC 20.1*  --  19.4* 21.6* 21.4* 21.8* 24.5*  NEUTROABS 17.9*  --   --   --   --   --  21.5*  HGB 13.8  < > 13.0 13.6 11.8* 12.0* 13.2  HCT 39.8  < > 36.8* 38.3* 33.3* 34.9* 39.3  MCV 82.7  --  81.8 82.2 84.3 84.9 85.6  PLT 344  --  323 331 311 308 336  < > = values in this interval not displayed. Cardiac Enzymes: No results found for this basename: CKTOTAL, CKMB, CKMBINDEX, TROPONINI,  in the last 168 hours BNP (last 3 results) No results found for this  basename: PROBNP,  in the last 8760 hours CBG:  Recent Labs Lab 07/10/13 2257 07/10/13 2342 07/11/13 0053 07/11/13 0202 07/11/13 0755  GLUCAP 150* 163* 183* 182* 183*    Recent Results (from the past 240 hour(s))  URINE CULTURE     Status: None   Collection Time    07/08/13 10:56 PM      Result Value Range Status   Specimen Description URINE, CLEAN CATCH   Final   Special Requests NONE   Final   Culture  Setup Time 07/09/2013 11:37   Final   Colony Count >=100,000 COLONIES/ML   Final   Culture     Final   Value: GROUP B STREP(S.AGALACTIAE)ISOLATED     Note: TESTING AGAINST S. AGALACTIAE NOT ROUTINELY PERFORMED DUE TO PREDICTABILITY OF AMP/PEN/VAN SUSCEPTIBILITY.   Report Status 07/10/2013 FINAL   Final  CULTURE, BLOOD (ROUTINE X 2)      Status: None   Collection Time    07/08/13 11:50 PM      Result Value Range Status   Specimen Description BLOOD LEFT FEMORAL ARTERY   Final   Special Requests BOTTLES DRAWN AEROBIC ONLY   Final   Culture  Setup Time 07/09/2013 10:38   Final   Culture     Final   Value:        BLOOD CULTURE RECEIVED NO GROWTH TO DATE CULTURE WILL BE HELD FOR 5 DAYS BEFORE ISSUING A FINAL NEGATIVE REPORT   Report Status PENDING   Incomplete  CULTURE, BLOOD (ROUTINE X 2)     Status: None   Collection Time    07/09/13 12:10 AM      Result Value Range Status   Specimen Description BLOOD LEFT ARM   Final   Special Requests BOTTLES DRAWN AEROBIC AND ANAEROBIC   Final   Culture  Setup Time 07/09/2013 10:42   Final   Culture     Final   Value:        BLOOD CULTURE RECEIVED NO GROWTH TO DATE CULTURE WILL BE HELD FOR 5 DAYS BEFORE ISSUING A FINAL NEGATIVE REPORT   Report Status PENDING   Incomplete  MRSA PCR SCREENING     Status: Abnormal   Collection Time    07/09/13  1:30 AM      Result Value Range Status   MRSA by PCR POSITIVE (*) NEGATIVE Final   Comment:            The GeneXpert MRSA Assay (FDA     approved for NASAL specimens     only), is one component of a     comprehensive MRSA colonization     surveillance program. It is not     intended to diagnose MRSA     infection nor to guide or     monitor treatment for     MRSA infections.     RESULT CALLED TO, READ BACK BY AND VERIFIED WITH:     STERN,R/2W @0429  ON 07/09/13 BY KARCZEWSKI,S.     Studies: Dg Chest 2 View  07/10/2013   *RADIOLOGY REPORT*  Clinical Data: Leukocytosis  CHEST - 2 VIEW  Comparison: None.  Findings: Cardiomegaly is noted.  No acute infiltrate or pulmonary edema.  Elevation of the right hemidiaphragm.  Question trace right pleural effusion.  Thoracic spine osteopenia.  IMPRESSION: No acute infiltrate or pulmonary edema.  Elevation of the right hemidiaphragm.  Cardiomegaly.  Question trace right pleural effusion.    Original Report Authenticated By: Lang Snow  Pop, M.D.    Scheduled Meds: . Chlorhexidine Gluconate Cloth  6 each Topical Q0600  . heparin  5,000 Units Subcutaneous Q8H  . insulin aspart  0-15 Units Subcutaneous TID WC  . insulin glargine  10 Units Subcutaneous QHS  . mupirocin ointment  1 application Nasal BID  . piperacillin-tazobactam (ZOSYN)  IV  3.375 g Intravenous Q8H  . polyethylene glycol  17 g Oral BID  . senna-docusate  1 tablet Oral BID  . vancomycin  750 mg Intravenous Q8H   Continuous Infusions: . sodium chloride Stopped (07/10/13 1410)    Principal Problem:   Sepsis Active Problems:   Hyperosmolar (nonketotic) coma   Abscess, gluteal, right   Tachycardia    Time spent: 45 minutes    [name] [name],[name] [name]  [facility] Pager [phone]  If 7PM-7AM, please contact night-coverage at [email or web address], password [facility] 07/11/2013, 11:16 AM  LOS: 3 days

## 2013-07-11 NOTE — Progress Notes (Signed)
Orthopaedic Trauma Service (OTS)  Subjective: 2 Days Post-Op Procedure(s) (LRB): IRRIGATION AND DEBRIDEMENT ABSCESS LEFT BUTTOCKS (Left), Right arm cellulitis Patient reports pain as mild.   Reports improved movement, pain, and decreased redness right arm though some residual below the shoulder.  Objective: Current Vitals Blood pressure 137/74, pulse 100, temperature 100.7 F (38.2 C), temperature source Oral, resp. rate 17, height 5\' 11"  (1.803 m), weight 77.111 kg (170 lb), SpO2 96.00%. Vital signs in last 24 hours: Temp:  [99.6 F (37.6 C)-100.8 F (38.2 C)] 100.7 F (38.2 C) (07/30 0400) Pulse Rate:  [97-112] 100 (07/30 0800) Resp:  [15-35] 17 (07/30 0800) BP: (127-144)/(56-74) 137/74 mmHg (07/30 0800) SpO2:  [92 %-100 %] 96 % (07/30 0800)  Intake/Output from previous day: 07/29 0701 - 07/30 0700 In: 2039.5 [I.V.:1639.5; IV Piggyback:400] Out: 1250 [Urine:1250]  LABS  Recent Labs  07/09/13 0220 07/09/13 0530 07/10/13 0334 07/10/13 1025 07/11/13 0337  HGB 13.0 13.6 11.8* 12.0* 13.2    Recent Labs  07/10/13 1025 07/11/13 0337  WBC 21.8* 24.5*  RBC 4.11* 4.59  HCT 34.9* 39.3  PLT 308 336    Recent Labs  07/10/13 2330 07/11/13 0337  NA 127* 125*  K 3.7 3.4*  CL 96 95*  CO2 21 21  BUN 15 15  CREATININE 1.02 0.99  GLUCOSE 169* 176*  CALCIUM 7.8* 7.9*   No results found for this basename: LABPT, INR,  in the last 72 hours  Blood cultures still pending from 7/27  Physical Exam  Right arm with decreased swelling, decreased tenderness, decreased erythema, and improved motion. Edema below ACE but resolving nicely underneath.  Imaging Dg Chest 2 View  07/10/2013   *RADIOLOGY REPORT*  Clinical Data: Leukocytosis  CHEST - 2 VIEW  Comparison: None.  Findings: Cardiomegaly is noted.  No acute infiltrate or pulmonary edema.  Elevation of the right hemidiaphragm.  Question trace right pleural effusion.  Thoracic spine osteopenia.  IMPRESSION: No acute  infiltrate or pulmonary edema.  Elevation of the right hemidiaphragm.  Cardiomegaly.  Question trace right pleural effusion.   Original Report Authenticated By: Natasha Mead, M.D.    Assessment/Plan: 2 Days Post-Op Procedure(s) (LRB): IRRIGATION AND DEBRIDEMENT ABSCESS LEFT BUTTOCKS (Left)  Increased WBC but unequivocally improved RUEX exam.  Will continue ACE for now and reevaluate in few days.  May need to recheck other sources. Cxs to be followed.   Myrene Galas, MD [facility] [phone] (p)   07/11/2013, 8:58 AM

## 2013-07-11 NOTE — Progress Notes (Signed)
Inpatient Diabetes Program Recommendations  AACE/ADA: New Consensus Statement on Inpatient Glycemic Control (2013)  Target Ranges:  Prepandial:   less than 140 mg/dL      Peak postprandial:   less than 180 mg/dL (1-2 hours)      Critically ill patients:  140 - 180 mg/dL   Reason for Visit: Consult for new onset DM.  Patient does not engage in conversation much about his new diagnosis.  He listens and says that it will be hard for him to take care of himself because he has to take care of his wife who is blind.  Encouraged patient to make lifestyle changes through diet and exercise when possible.  Patient has a very flat affect.  Pt does not have any questions/concerns at this time.  Thank you  Piedad Climes BSN, RN,CDE Inpatient Diabetes Coordinator [phone] (team pager)

## 2013-07-11 NOTE — Progress Notes (Signed)
2 Days Post-Op  Subjective: Alert. Stable.Denies and gluteal or perineal pain. Left gluteal wound dressings changes going well. Wound is very clean according to nursing staff.  Temple 100.8.WBC 24,500. Hemoglobin 11.2.  Objective: Vital signs in last 24 hours: Temp:  [98.8 F (37.1 C)-100.8 F (38.2 C)] 100.8 F (38.2 C) (07/30 0000) Pulse Rate:  [97-112] 100 (07/30 0600) Resp:  [15-35] 17 (07/30 0600) BP: (112-144)/(56-70) 132/64 mmHg (07/30 0300) SpO2:  [92 %-100 %] 100 % (07/30 0600) Last BM Date: 07/07/13  Intake/Output from previous day: 07/29 0701 - 07/30 0700 In: 1914.5 [I.V.:1514.5; IV Piggyback:400] Out: 1250 [Urine:1250] Intake/Output this shift: Total I/O In: 1235 [I.V.:1035; IV Piggyback:200] Out: 550 [Urine:550]  General appearance: alert. Oriented. Does not appear toxic. Mental status normal Male genitalia: normal, penis scrotum and testes normal. No evidence of tossed soft tissue infection of genitalia or inguinal areas. Skin:  left gluteal wound clean. No evidence of invasive infection or cellulitis.  Lab Results:  Results for orders placed during the hospital encounter of 07/08/13 (from the past 24 hour(s))  GLUCOSE, CAPILLARY     Status: Abnormal   Collection Time    07/10/13  7:45 AM      Result Value Range   Glucose-Capillary 359 (*) 70 - 99 mg/dL  BASIC METABOLIC PANEL     Status: Abnormal   Collection Time    07/10/13  8:25 AM      Result Value Range   Sodium 127 (*) 135 - 145 mEq/L   Potassium 4.0  3.5 - 5.1 mEq/L   Chloride 94 (*) 96 - 112 mEq/L   CO2 17 (*) 19 - 32 mEq/L   Glucose, Bld 286 (*) 70 - 99 mg/dL   BUN 14  6 - 23 mg/dL   Creatinine, Ser 1.47  0.50 - 1.35 mg/dL   Calcium 7.6 (*) 8.4 - 10.5 mg/dL   GFR calc non Af Amer 85 (*) >90 mL/min   GFR calc Af Amer >90  >90 mL/min  BASIC METABOLIC PANEL     Status: Abnormal   Collection Time    07/10/13 10:25 AM      Result Value Range   Sodium 129 (*) 135 - 145 mEq/L   Potassium 4.1   3.5 - 5.1 mEq/L   Chloride 96  96 - 112 mEq/L   CO2 19  19 - 32 mEq/L   Glucose, Bld 281 (*) 70 - 99 mg/dL   BUN 14  6 - 23 mg/dL   Creatinine, Ser 8.29  0.50 - 1.35 mg/dL   Calcium 7.9 (*) 8.4 - 10.5 mg/dL   GFR calc non Af Amer 75 (*) >90 mL/min   GFR calc Af Amer 86 (*) >90 mL/min  CBC     Status: Abnormal   Collection Time    07/10/13 10:25 AM      Result Value Range   WBC 21.8 (*) 4.0 - 10.5 K/uL   RBC 4.11 (*) 4.22 - 5.81 MIL/uL   Hemoglobin 12.0 (*) 13.0 - 17.0 g/dL   HCT 56.2 (*) 13.0 - 86.5 %   MCV 84.9  78.0 - 100.0 fL   MCH 29.2  26.0 - 34.0 pg   MCHC 34.4  30.0 - 36.0 g/dL   RDW 78.4  69.6 - 29.5 %   Platelets 308  150 - 400 K/uL  GLUCOSE, CAPILLARY     Status: Abnormal   Collection Time    07/10/13 10:44 AM      Result  Value Range   Glucose-Capillary 370 (*) 70 - 99 mg/dL  GLUCOSE, CAPILLARY     Status: Abnormal   Collection Time    07/10/13 11:33 AM      Result Value Range   Glucose-Capillary 268 (*) 70 - 99 mg/dL  BASIC METABOLIC PANEL     Status: Abnormal   Collection Time    07/10/13 12:05 PM      Result Value Range   Sodium 131 (*) 135 - 145 mEq/L   Potassium 3.9  3.5 - 5.1 mEq/L   Chloride 98  96 - 112 mEq/L   CO2 20  19 - 32 mEq/L   Glucose, Bld 258 (*) 70 - 99 mg/dL   BUN 14  6 - 23 mg/dL   Creatinine, Ser 1.61  0.50 - 1.35 mg/dL   Calcium 8.0 (*) 8.4 - 10.5 mg/dL   GFR calc non Af Amer 84 (*) >90 mL/min   GFR calc Af Amer >90  >90 mL/min  GLUCOSE, CAPILLARY     Status: Abnormal   Collection Time    07/10/13  1:02 PM      Result Value Range   Glucose-Capillary 245 (*) 70 - 99 mg/dL   Comment 1 Glucose Stabilizer    BASIC METABOLIC PANEL     Status: Abnormal   Collection Time    07/10/13  1:34 PM      Result Value Range   Sodium 132 (*) 135 - 145 mEq/L   Potassium 3.8  3.5 - 5.1 mEq/L   Chloride 98  96 - 112 mEq/L   CO2 22  19 - 32 mEq/L   Glucose, Bld 227 (*) 70 - 99 mg/dL   BUN 15  6 - 23 mg/dL   Creatinine, Ser 0.96  0.50 - 1.35  mg/dL   Calcium 8.0 (*) 8.4 - 10.5 mg/dL   GFR calc non Af Amer 84 (*) >90 mL/min   GFR calc Af Amer >90  >90 mL/min  GLUCOSE, CAPILLARY     Status: Abnormal   Collection Time    07/10/13  2:08 PM      Result Value Range   Glucose-Capillary 200 (*) 70 - 99 mg/dL  GLUCOSE, CAPILLARY     Status: Abnormal   Collection Time    07/10/13  3:08 PM      Result Value Range   Glucose-Capillary 215 (*) 70 - 99 mg/dL   Comment 1 Glucose Stabilizer    BASIC METABOLIC PANEL     Status: Abnormal   Collection Time    07/10/13  4:00 PM      Result Value Range   Sodium 130 (*) 135 - 145 mEq/L   Potassium 3.9  3.5 - 5.1 mEq/L   Chloride 97  96 - 112 mEq/L   CO2 22  19 - 32 mEq/L   Glucose, Bld 181 (*) 70 - 99 mg/dL   BUN 14  6 - 23 mg/dL   Creatinine, Ser 0.45  0.50 - 1.35 mg/dL   Calcium 8.0 (*) 8.4 - 10.5 mg/dL   GFR calc non Af Amer 84 (*) >90 mL/min   GFR calc Af Amer >90  >90 mL/min  VANCOMYCIN, TROUGH     Status: None   Collection Time    07/10/13  4:01 PM      Result Value Range   Vancomycin Tr 10.6  10.0 - 20.0 ug/mL  GLUCOSE, CAPILLARY     Status: Abnormal   Collection Time  07/10/13  4:14 PM      Result Value Range   Glucose-Capillary 188 (*) 70 - 99 mg/dL   Comment 1 Documented in Chart     Comment 2 Notify RN    GLUCOSE, CAPILLARY     Status: Abnormal   Collection Time    07/10/13  5:21 PM      Result Value Range   Glucose-Capillary 159 (*) 70 - 99 mg/dL   Comment 1 Documented in Chart     Comment 2 Notify RN    GLUCOSE, CAPILLARY     Status: Abnormal   Collection Time    07/10/13  6:21 PM      Result Value Range   Glucose-Capillary 130 (*) 70 - 99 mg/dL   Comment 1 Documented in Chart     Comment 2 Notify RN    GLUCOSE, CAPILLARY     Status: Abnormal   Collection Time    07/10/13  7:17 PM      Result Value Range   Glucose-Capillary 168 (*) 70 - 99 mg/dL   Comment 1 Documented in Chart     Comment 2 Notify RN    BASIC METABOLIC PANEL     Status: Abnormal    Collection Time    07/10/13  7:39 PM      Result Value Range   Sodium 127 (*) 135 - 145 mEq/L   Potassium 3.6  3.5 - 5.1 mEq/L   Chloride 97  96 - 112 mEq/L   CO2 21  19 - 32 mEq/L   Glucose, Bld 132 (*) 70 - 99 mg/dL   BUN 14  6 - 23 mg/dL   Creatinine, Ser 1.61  0.50 - 1.35 mg/dL   Calcium 7.7 (*) 8.4 - 10.5 mg/dL   GFR calc non Af Amer 84 (*) >90 mL/min   GFR calc Af Amer >90  >90 mL/min  GLUCOSE, CAPILLARY     Status: Abnormal   Collection Time    07/10/13  8:29 PM      Result Value Range   Glucose-Capillary 119 (*) 70 - 99 mg/dL   Comment 1 Documented in Chart     Comment 2 Notify RN    BASIC METABOLIC PANEL     Status: Abnormal   Collection Time    07/10/13  9:02 PM      Result Value Range   Sodium 126 (*) 135 - 145 mEq/L   Potassium 3.7  3.5 - 5.1 mEq/L   Chloride 95 (*) 96 - 112 mEq/L   CO2 21  19 - 32 mEq/L   Glucose, Bld 123 (*) 70 - 99 mg/dL   BUN 14  6 - 23 mg/dL   Creatinine, Ser 0.96  0.50 - 1.35 mg/dL   Calcium 7.6 (*) 8.4 - 10.5 mg/dL   GFR calc non Af Amer 85 (*) >90 mL/min   GFR calc Af Amer >90  >90 mL/min  GLUCOSE, CAPILLARY     Status: Abnormal   Collection Time    07/10/13  9:35 PM      Result Value Range   Glucose-Capillary 133 (*) 70 - 99 mg/dL   Comment 1 Documented in Chart     Comment 2 Notify RN    GLUCOSE, CAPILLARY     Status: Abnormal   Collection Time    07/10/13 10:57 PM      Result Value Range   Glucose-Capillary 150 (*) 70 - 99 mg/dL   Comment 1 Documented in Chart  Comment 2 Notify RN    BASIC METABOLIC PANEL     Status: Abnormal   Collection Time    07/10/13 11:30 PM      Result Value Range   Sodium 127 (*) 135 - 145 mEq/L   Potassium 3.7  3.5 - 5.1 mEq/L   Chloride 96  96 - 112 mEq/L   CO2 21  19 - 32 mEq/L   Glucose, Bld 169 (*) 70 - 99 mg/dL   BUN 15  6 - 23 mg/dL   Creatinine, Ser 1.61  0.50 - 1.35 mg/dL   Calcium 7.8 (*) 8.4 - 10.5 mg/dL   GFR calc non Af Amer 75 (*) >90 mL/min   GFR calc Af Amer 86 (*) >90  mL/min  GLUCOSE, CAPILLARY     Status: Abnormal   Collection Time    07/10/13 11:42 PM      Result Value Range   Glucose-Capillary 163 (*) 70 - 99 mg/dL   Comment 1 Documented in Chart     Comment 2 Notify RN    GLUCOSE, CAPILLARY     Status: Abnormal   Collection Time    07/11/13 12:53 AM      Result Value Range   Glucose-Capillary 183 (*) 70 - 99 mg/dL   Comment 1 Documented in Chart     Comment 2 Notify RN     Comment 3 Glucose Stabilizer    GLUCOSE, CAPILLARY     Status: Abnormal   Collection Time    07/11/13  2:02 AM      Result Value Range   Glucose-Capillary 182 (*) 70 - 99 mg/dL  BASIC METABOLIC PANEL     Status: Abnormal   Collection Time    07/11/13  3:37 AM      Result Value Range   Sodium 125 (*) 135 - 145 mEq/L   Potassium 3.4 (*) 3.5 - 5.1 mEq/L   Chloride 95 (*) 96 - 112 mEq/L   CO2 21  19 - 32 mEq/L   Glucose, Bld 176 (*) 70 - 99 mg/dL   BUN 15  6 - 23 mg/dL   Creatinine, Ser 0.96  0.50 - 1.35 mg/dL   Calcium 7.9 (*) 8.4 - 10.5 mg/dL   GFR calc non Af Amer 83 (*) >90 mL/min   GFR calc Af Amer >90  >90 mL/min  CBC WITH DIFFERENTIAL     Status: Abnormal   Collection Time    07/11/13  3:37 AM      Result Value Range   WBC 24.5 (*) 4.0 - 10.5 K/uL   RBC 4.59  4.22 - 5.81 MIL/uL   Hemoglobin 13.2  13.0 - 17.0 g/dL   HCT 04.5  40.9 - 81.1 %   MCV 85.6  78.0 - 100.0 fL   MCH 28.8  26.0 - 34.0 pg   MCHC 33.6  30.0 - 36.0 g/dL   RDW 91.4  78.2 - 95.6 %   Platelets 336  150 - 400 K/uL   Neutrophils Relative % 88 (*) 43 - 77 %   Neutro Abs 21.5 (*) 1.7 - 7.7 K/uL   Lymphocytes Relative 6 (*) 12 - 46 %   Lymphs Abs 1.4  0.7 - 4.0 K/uL   Monocytes Relative 6  3 - 12 %   Monocytes Absolute 1.4 (*) 0.1 - 1.0 K/uL   Eosinophils Relative 1  0 - 5 %   Eosinophils Absolute 0.2  0.0 - 0.7 K/uL   Basophils Relative  0  0 - 1 %   Basophils Absolute 0.0  0.0 - 0.1 K/uL     Studies/Results: @RISRSLT24 @  . Chlorhexidine Gluconate Cloth  6 each Topical Q0600  .  heparin  5,000 Units Subcutaneous Q8H  . insulin aspart  0-15 Units Subcutaneous TID WC  . insulin glargine  10 Units Subcutaneous QHS  . mupirocin ointment  1 application Nasal BID  . piperacillin-tazobactam (ZOSYN)  IV  3.375 g Intravenous Q8H  . polyethylene glycol  17 g Oral BID  . senna-docusate  1 tablet Oral BID  . vancomycin  750 mg Intravenous Q8H     Assessment/Plan: s/p Procedure(s): IRRIGATION AND DEBRIDEMENT ABSCESS LEFT BUTTOCKS   POD #2. Debridement soft tissue infection left gluteal area. This infection seems to be under control. On vancomycin and Zosyn.  Significant leukocytosis. This cannot be explained by the left gluteal wound. I'm concerned that there is another source for this.  Right elbow cellulitis. Being followed by orthopedics.  New diagnosis of diabetes mellitus. Has been transitioned off of insulin drip.  @PROBHOSP @  LOS: 3 days    Deretha Ertle M 07/11/2013  . .prob

## 2013-07-11 NOTE — Progress Notes (Signed)
  RD consulted for nutrition education regarding diabetes.   Lab Results  Component Value Date   HGBA1C 12.5* 07/09/2013    RD provided "Carbohydrate Counting for People with Diabetes" handout from the Academy of [employer] and Dietetics. Discussed different food groups and their effects on blood sugar, emphasizing carbohydrate-containing foods. Provided list of carbohydrates and recommended serving sizes of common foods. Pt reports he usually drinks 1/2 gallon of milk daily; encouraged pt to decrease intake to 3 cups per day.   Pt reports that he typically only eats 1 meal per day because he spends most of his time taking care of his wife who is blind. Discussed importance of controlled and consistent carbohydrate intake throughout the day. Pt states he will aim to eat 2-3 meals daily and 1-2 snacks. Provided examples of ways to balance meals/snacks and encouraged intake of high-fiber, whole grain complex carbohydrates. Teach back method used.  Expect good compliance.  Body mass index is 23.72 kg/(m^2). Pt meets criteria for Normal weight based on current BMI.  Current diet order is Carb Modified. RD helped pt order his first meal since admission at time of visit due to previous NPO status. Labs and medications reviewed. No further nutrition interventions warranted at this time. RD contact information provided. Encouraged pt to call with any questions. If additional nutrition issues arise, please re-consult RD.  Ian Malkin RD, LDN Inpatient Clinical Dietitian Pager: [phone] After Hours Pager: [phone]

## 2013-07-12 LAB — BASIC METABOLIC PANEL
BUN: 16 mg/dL (ref 6–23)
CO2: 23 mEq/L (ref 19–32)
Calcium: 7.5 mg/dL — ABNORMAL LOW (ref 8.4–10.5)
Creatinine, Ser: 1.02 mg/dL (ref 0.50–1.35)
GFR calc non Af Amer: 75 mL/min — ABNORMAL LOW (ref >90)
Glucose, Bld: 215 mg/dL — ABNORMAL HIGH (ref 70–99)

## 2013-07-12 LAB — CBC
MCH: 29.2 pg (ref 26.0–34.0)
MCHC: 34.2 g/dL (ref 30.0–36.0)
MCV: 85.4 fL (ref 78.0–100.0)
Platelets: 323 10*3/uL (ref 150–400)
RBC: 4.04 MIL/uL — ABNORMAL LOW (ref 4.22–5.81)
RDW: 13.6 % (ref 11.5–15.5)

## 2013-07-12 LAB — GLUCOSE, CAPILLARY
Glucose-Capillary: 210 mg/dL — ABNORMAL HIGH (ref 70–99)
Glucose-Capillary: 184 mg/dL — ABNORMAL HIGH (ref 70–99)
Glucose-Capillary: 210 mg/dL — ABNORMAL HIGH (ref 70–99)

## 2013-07-12 MED ORDER — ZOLPIDEM TARTRATE 5 MG PO TABS
5.0000 mg | ORAL_TABLET | Freq: Every evening | ORAL | Status: DC | PRN
  Administered 2013-07-12 – 2013-07-13 (×2): 5 mg via ORAL
  Filled 2013-07-12 (×2): qty 1

## 2013-07-12 MED ORDER — "BD GETTING STARTED TAKE HOME KIT: 1ML X 30 G SYRINGES, "
1.0000 | Freq: Once | Status: AC
  Administered 2013-07-12: 1
  Filled 2013-07-12: qty 1

## 2013-07-12 MED ORDER — LIVING WELL WITH DIABETES BOOK
Freq: Once | Status: AC
  Administered 2013-07-12: 16:00:00
  Filled 2013-07-12: qty 1

## 2013-07-12 MED ORDER — BD GETTING STARTED TAKE HOME KIT: 1/2ML X 30G SYRINGES
1.0000 | Freq: Once | Status: DC
  Filled 2013-07-12: qty 1

## 2013-07-12 MED ORDER — DIPHENHYDRAMINE HCL 50 MG PO CAPS: 50.0000 mg | ORAL_CAPSULE | Freq: Every evening | ORAL | Status: DC | PRN

## 2013-07-12 NOTE — Progress Notes (Signed)
Met with pt at bedside to discuss home health needs. 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 CSW is guiding a friend on how to get her placed as he can't care for her after discharge until he is recovered.  Pt has a wound and might need a HHRN for wound care teaching as well as diabetes teaching. He informed me that he only has Medicare Part A and [facility] as his secondary. I called the [facility] to check on [facility] services benefit. I was informed that they cannot pay for any [facility] services until he is seen by one of their providers. They provided me with the # for pt to call and make an appointment. [facility] will also not pay for pt's prescriptions until he has established care with them. I spoke with pt about this, I gave him the # to call and make an appointment as soon as possible. He verbalized understanding.   I spoke with the bedside RN and asked her to provide diabetic teaching and wound care teaching here at the hospital since [facility] services is not an option at this point.   Algernon Huxley RN BSN  [phone]

## 2013-07-12 NOTE — Progress Notes (Signed)
Inpatient Diabetes Program Recommendations  AACE/ADA: New Consensus Statement on Inpatient Glycemic Control (2013)  Target Ranges:  Prepandial:   less than 140 mg/dL      Peak postprandial:   less than 180 mg/dL (1-2 hours)      Critically ill patients:  140 - 180 mg/dL   Inpatient Diabetes Program Recommendations Insulin - Basal: Increase Lantus to 15 units  MD, Will patient be discharged home on insulin?  Please address so that bedside education can begin. Thank you  Piedad Climes BSN, RN,CDE Inpatient Diabetes Coordinator [phone] (team pager)

## 2013-07-12 NOTE — Progress Notes (Signed)
Patient ID: [name] [name], male   DOB: 21-Apr-1946, 67 y.o.   MRN: [id number] 3 Days Post-Op  Subjective: No complaints, buttocks sore, has concerns about his wife and his discharge plans  Objective: Vital signs in last 24 hours: Temp:  [98 F (36.7 C)-98.8 F (37.1 C)] 98.8 F (37.1 C) (07/31 0510) Pulse Rate:  [95-108] 96 (07/31 0510) Resp:  [20-30] 20 (07/31 0510) BP: (105-136)/(59-79) 128/70 mmHg (07/31 0510) SpO2:  [93 %-97 %] 96 % (07/31 0510) Last BM Date: 07/07/13  Intake/Output from previous day: 07/30 0701 - 07/31 0700 In: 1893.8 [I.V.:1893.8] Out: 1450 [Urine:1450] Intake/Output this shift:    General: alert. Oriented. Skin:  left gluteal wound clean around edges. No evidence of invasive infection or cellulitis.  Lab Results:  Results for orders placed during the hospital encounter of 07/08/13 (from the past 24 hour(s))  GLUCOSE, CAPILLARY     Status: Abnormal   Collection Time    07/11/13 11:17 AM      Result Value Range   Glucose-Capillary 194 (*) 70 - 99 mg/dL  URINALYSIS, ROUTINE W REFLEX MICROSCOPIC     Status: Abnormal   Collection Time    07/11/13 12:55 PM      Result Value Range   Color, Urine YELLOW  YELLOW   APPearance CLOUDY (*) CLEAR   Specific Gravity, Urine 1.030  1.005 - 1.030   pH 5.5  5.0 - 8.0   Glucose, UA >1000 (*) NEGATIVE mg/dL   Hgb urine dipstick NEGATIVE  NEGATIVE   Bilirubin Urine NEGATIVE  NEGATIVE   Ketones, ur 40 (*) NEGATIVE mg/dL   Protein, ur 30 (*) NEGATIVE mg/dL   Urobilinogen, UA 1.0  0.0 - 1.0 mg/dL   Nitrite NEGATIVE  NEGATIVE   Leukocytes, UA NEGATIVE  NEGATIVE  URINE MICROSCOPIC-ADD ON     Status: Abnormal   Collection Time    07/11/13 12:55 PM      Result Value Range   Squamous Epithelial / LPF FEW (*) RARE   Bacteria, UA FEW (*) RARE   Urine-Other MUCOUS PRESENT    GLUCOSE, CAPILLARY     Status: Abnormal   Collection Time    07/11/13  4:58 PM      Result Value Range   Glucose-Capillary 232 (*) 70 - 99 mg/dL   Comment 1 Notify RN    GLUCOSE, CAPILLARY     Status: Abnormal   Collection Time    07/11/13  9:34 PM      Result Value Range   Glucose-Capillary 214 (*) 70 - 99 mg/dL  BASIC METABOLIC PANEL     Status: Abnormal   Collection Time    07/12/13  5:05 AM      Result Value Range   Sodium 129 (*) 135 - 145 mEq/L   Potassium 3.8  3.5 - 5.1 mEq/L   Chloride 96  96 - 112 mEq/L   CO2 23  19 - 32 mEq/L   Glucose, Bld 215 (*) 70 - 99 mg/dL   BUN 16  6 - 23 mg/dL   Creatinine, Ser 7.82  0.50 - 1.35 mg/dL   Calcium 7.5 (*) 8.4 - 10.5 mg/dL   GFR calc non Af Amer 75 (*) >90 mL/min   GFR calc Af Amer 86 (*) >90 mL/min  CBC     Status: Abnormal   Collection Time    07/12/13  5:05 AM      Result Value Range   WBC 15.9 (*) 4.0 - 10.5 K/uL  RBC 4.04 (*) 4.22 - 5.81 MIL/uL   Hemoglobin 11.8 (*) 13.0 - 17.0 g/dL   HCT 16.1 (*) 09.6 - 04.5 %   MCV 85.4  78.0 - 100.0 fL   MCH 29.2  26.0 - 34.0 pg   MCHC 34.2  30.0 - 36.0 g/dL   RDW 40.9  81.1 - 91.4 %   Platelets 323  150 - 400 K/uL  GLUCOSE, CAPILLARY     Status: Abnormal   Collection Time    07/12/13  7:18 AM      Result Value Range   Glucose-Capillary 210 (*) 70 - 99 mg/dL     Studies/Results: @RISRSLT24 @  . Chlorhexidine Gluconate Cloth  6 each Topical Q0600  . heparin  5,000 Units Subcutaneous Q8H  . insulin aspart  0-15 Units Subcutaneous TID WC  . insulin glargine  10 Units Subcutaneous QHS  . mupirocin ointment  1 application Nasal BID  . piperacillin-tazobactam (ZOSYN)  IV  3.375 g Intravenous Q8H  . polyethylene glycol  17 g Oral BID  . senna-docusate  1 tablet Oral BID  . vancomycin  750 mg Intravenous Q8H     Assessment/Plan:  POD #3- Debridement soft tissue infection left gluteal area. This infection seems to be under control. On vancomycin and Zosyn.  Significant leukocytosis- improving slowly, down to 15.9 today  Right elbow cellulitis. Being followed by orthopedics.  New diagnosis of diabetes mellitus. Has  been transitioned off of insulin drip.  IM managing  Patient has multiple discharge planning needs due to him being the care giver for his wife, the patient's brother is willing to help with patient at home but they are unable to care for the patient's wife.  They would like to talk to the care manager to help with placement of his wife short term while he recovers.    LOS: 4 days    [name] [name] 07/12/2013  .

## 2013-07-12 NOTE — Progress Notes (Signed)
[facility] PROGRESS NOTE  Averi Cacioppo ZOX:[id number] DOB: September 27, 1946 DOA: 07/08/2013 PCP: No primary provider on file.  Assessment/Plan: Left Gluteal Abscess -Continue with vanc/zosyn day 4. -S/p I and D of abscess 7/29.  Right Elbow Cellulitis -Seen by ortho. -They do not believe this is septic arthritis, but more of a cellulitis. -Improved per ortho report. -Plan to continue current abx.  Sepsis -Presumed 2/2 above. -All cx data remains negative to date. -Repeat blood cx, U/A, urine cx and CXR all negative so far. -CT abd/pelvis 7/28 negative except for gluteal abscess findings. -Leukocytosis improving. -Has been afebrile overnight. -Consider changing to PO antibiotics in am.  HONK -Resolved. -CBGs with fair control. -New DM diagnosis. -Will need to go home on insulin.  Hyponatremia -Follow. -Initially thought to be false related to hyperglycemia. -Slowly correcting.  Leukocytosis -Improving -See above for details.  Code Status: Full code Family Communication: Patient only  Disposition Plan: Home in 48 hours.   Consultants:  Ortho  Gen Surgery   Antibiotics:  Vanc  Zosyn   Subjective: No complaints today.  Objective: Filed Vitals:   07/11/13 2110 07/12/13 0157 07/12/13 0510 07/12/13 1439  BP: 136/66 120/64 128/70 139/77  Pulse: 108 95 96 105  Temp: 98 F (36.7 C) 98.4 F (36.9 C) 98.8 F (37.1 C) 98 F (36.7 C)  TempSrc: Oral Oral Oral Oral  Resp: 22 20 20 20   Height:      Weight:      SpO2: 96% 97% 96% 97%    Intake/Output Summary (Last 24 hours) at 07/12/13 1443 Last data filed at 07/12/13 1440  Gross per 24 hour  Intake 3248.33 ml  Output   2025 ml  Net 1223.33 ml   Filed Weights   07/08/13 2135  Weight: 77.111 kg (170 lb)    Exam:   General:  AA Ox3  Cardiovascular: RRR, no M/R/G  Respiratory: CTA B  Abdomen: S/NT/ND/+BS/no masses or organomegaly noted.  Extremities: Right elbow in ACE wrap, no  C/C/E/+pedal pulses   Neurologic:  Grossly intact and nonfocal.  Data Reviewed: Basic Metabolic Panel:  Recent Labs Lab 07/10/13 1939 07/10/13 2102 07/10/13 2330 07/11/13 0337 07/12/13 0505  NA 127* 126* 127* 125* 129*  K 3.6 3.7 3.7 3.4* 3.8  CL 97 95* 96 95* 96  CO2 21 21 21 21 23   GLUCOSE 132* 123* 169* 176* 215*  BUN 14 14 15 15 16   CREATININE 0.98 0.95 1.02 0.99 1.02  CALCIUM 7.7* 7.6* 7.8* 7.9* 7.5*   Liver Function Tests:  Recent Labs Lab 07/08/13 2152 07/09/13 0530  AST 17 20  ALT 16 14  ALKPHOS 157* 131*  BILITOT 0.3 0.4  PROT 6.4 6.1  ALBUMIN 2.5* 2.3*   No results found for this basename: LIPASE, AMYLASE,  in the last 168 hours No results found for this basename: AMMONIA,  in the last 168 hours CBC:  Recent Labs Lab 07/08/13 2152  07/09/13 0530 07/10/13 0334 07/10/13 1025 07/11/13 0337 07/12/13 0505  WBC 20.1*  < > 21.6* 21.4* 21.8* 24.5* 15.9*  NEUTROABS 17.9*  --   --   --   --  21.5*  --   HGB 13.8  < > 13.6 11.8* 12.0* 13.2 11.8*  HCT 39.8  < > 38.3* 33.3* 34.9* 39.3 34.5*  MCV 82.7  < > 82.2 84.3 84.9 85.6 85.4  PLT 344  < > 331 311 308 336 323  < > = values in this interval not displayed. Cardiac Enzymes: No  results found for this basename: CKTOTAL, CKMB, CKMBINDEX, TROPONINI,  in the last 168 hours BNP (last 3 results) No results found for this basename: PROBNP,  in the last 8760 hours CBG:  Recent Labs Lab 07/11/13 0755 07/11/13 1117 07/11/13 1658 07/11/13 2134 07/12/13 0718  GLUCAP 183* 194* 232* 214* 210*    Recent Results (from the past 240 hour(s))  URINE CULTURE     Status: None   Collection Time    07/08/13 10:56 PM      Result Value Range Status   Specimen Description URINE, CLEAN CATCH   Final   Special Requests NONE   Final   Culture  Setup Time 07/09/2013 11:37   Final   Colony Count >=100,000 COLONIES/ML   Final   Culture     Final   Value: GROUP B STREP(S.AGALACTIAE)ISOLATED     Note: TESTING AGAINST S.  AGALACTIAE NOT ROUTINELY PERFORMED DUE TO PREDICTABILITY OF AMP/PEN/VAN SUSCEPTIBILITY.   Report Status 07/10/2013 FINAL   Final  CULTURE, BLOOD (ROUTINE X 2)     Status: None   Collection Time    07/08/13 11:50 PM      Result Value Range Status   Specimen Description BLOOD LEFT FEMORAL ARTERY   Final   Special Requests BOTTLES DRAWN AEROBIC ONLY   Final   Culture  Setup Time 07/09/2013 10:38   Final   Culture     Final   Value:        BLOOD CULTURE RECEIVED NO GROWTH TO DATE CULTURE WILL BE HELD FOR 5 DAYS BEFORE ISSUING A FINAL NEGATIVE REPORT   Report Status PENDING   Incomplete  CULTURE, BLOOD (ROUTINE X 2)     Status: None   Collection Time    07/09/13 12:10 AM      Result Value Range Status   Specimen Description BLOOD LEFT ARM   Final   Special Requests BOTTLES DRAWN AEROBIC AND ANAEROBIC   Final   Culture  Setup Time 07/09/2013 10:42   Final   Culture     Final   Value:        BLOOD CULTURE RECEIVED NO GROWTH TO DATE CULTURE WILL BE HELD FOR 5 DAYS BEFORE ISSUING A FINAL NEGATIVE REPORT   Report Status PENDING   Incomplete  MRSA PCR SCREENING     Status: Abnormal   Collection Time    07/09/13  1:30 AM      Result Value Range Status   MRSA by PCR POSITIVE (*) NEGATIVE Final   Comment:            The GeneXpert MRSA Assay (FDA     approved for NASAL specimens     only), is one component of a     comprehensive MRSA colonization     surveillance program. It is not     intended to diagnose MRSA     infection nor to guide or     monitor treatment for     MRSA infections.     RESULT CALLED TO, READ BACK BY AND VERIFIED WITH:     STERN,R/2W @0429  ON 07/09/13 BY KARCZEWSKI,S.  CULTURE, BLOOD (ROUTINE X 2)     Status: None   Collection Time    07/11/13 12:15 PM      Result Value Range Status   Specimen Description BLOOD LEFT HAND   Final   Special Requests BOTTLES DRAWN AEROBIC ONLY 1CC   Final   Culture  Setup Time 07/11/2013 14:34  Final   Culture     Final    Value:        BLOOD CULTURE RECEIVED NO GROWTH TO DATE CULTURE WILL BE HELD FOR 5 DAYS BEFORE ISSUING A FINAL NEGATIVE REPORT   Report Status PENDING   Incomplete  CULTURE, BLOOD (ROUTINE X 2)     Status: None   Collection Time    07/11/13 12:25 PM      Result Value Range Status   Specimen Description BLOOD LEFT ARM   Final   Special Requests BOTTLES DRAWN AEROBIC AND ANAEROBIC 4CC   Final   Culture  Setup Time 07/11/2013 14:34   Final   Culture     Final   Value:        BLOOD CULTURE RECEIVED NO GROWTH TO DATE CULTURE WILL BE HELD FOR 5 DAYS BEFORE ISSUING A FINAL NEGATIVE REPORT   Report Status PENDING   Incomplete     Studies: Dg Chest Port 1 View  07/11/2013   *RADIOLOGY REPORT*  Clinical Data: Fever, leukocytosis, possible pneumonia  PORTABLE CHEST - 1 VIEW  Comparison: 07/10/2013  Findings: Mildly enlarged cardiac silhouette.  Poor inspiratory effect.  There appears to be vascular congestion likely exaggerated or entirely accounted for  by poor inspiration.  There is mild medial lower lobe atelectasis on the right.  IMPRESSION: Poor  inspiratory effect.  Allowing for this, no acute findings. If concern for pneumonia, PA and lateral study or CT thorax suggested if symptoms persist.   Original Report Authenticated By: Esperanza Heir, M.D.    Scheduled Meds: . Chlorhexidine Gluconate Cloth  6 each Topical Q0600  . heparin  5,000 Units Subcutaneous Q8H  . insulin aspart  0-15 Units Subcutaneous TID WC  . insulin glargine  10 Units Subcutaneous QHS  . mupirocin ointment  1 application Nasal BID  . piperacillin-tazobactam (ZOSYN)  IV  3.375 g Intravenous Q8H  . polyethylene glycol  17 g Oral BID  . senna-docusate  1 tablet Oral BID  . vancomycin  750 mg Intravenous Q8H   Continuous Infusions: . sodium chloride 125 mL/hr at 07/12/13 1126    Principal Problem:   Sepsis Active Problems:   Hyperosmolar (nonketotic) coma   Abscess, gluteal, right   Tachycardia    Time spent: 45  minutes    HERNANDEZ ACOSTA,ESTELA  [facility] Pager [phone]  If 7PM-7AM, please contact night-coverage at [email or web address], password [facility] 07/12/2013, 2:43 PM  LOS: 4 days

## 2013-07-12 NOTE — Progress Notes (Signed)
Patient interviewed and examined this morning. I agree with the assessment and treatment plan as outlined by Lanora Manis white, PA. We will plan to examine his left gluteal wound tomorrow.   Angelia Mould. Derrell Lolling, M.D., [facility] General and Minimally invasive Surgery Breast and Colorectal Surgery Office:   [phone] Pager:   [phone]

## 2013-07-13 LAB — URINE CULTURE
Colony Count: NO GROWTH
Culture: NO GROWTH

## 2013-07-13 LAB — BASIC METABOLIC PANEL
Chloride: 100 mEq/L (ref 96–112)
GFR calc Af Amer: >90 mL/min (ref >90)
GFR calc non Af Amer: 85 mL/min — ABNORMAL LOW (ref >90)
Glucose, Bld: 239 mg/dL — ABNORMAL HIGH (ref 70–99)
Potassium: 3.5 mEq/L (ref 3.5–5.1)
Sodium: 131 mEq/L — ABNORMAL LOW (ref 135–145)

## 2013-07-13 LAB — CBC
HCT: 34.3 % — ABNORMAL LOW (ref 39.0–52.0)
Hemoglobin: 11.6 g/dL — ABNORMAL LOW (ref 13.0–17.0)
MCH: 28.6 pg (ref 26.0–34.0)
RBC: 4.06 MIL/uL — ABNORMAL LOW (ref 4.22–5.81)

## 2013-07-13 LAB — GLUCOSE, CAPILLARY
Glucose-Capillary: 204 mg/dL — ABNORMAL HIGH (ref 70–99)
Glucose-Capillary: 161 mg/dL — ABNORMAL HIGH (ref 70–99)

## 2013-07-13 MED ORDER — DOXYCYCLINE HYCLATE 100 MG PO TABS
100.0000 mg | ORAL_TABLET | Freq: Two times a day (BID) | ORAL | Status: DC
  Administered 2013-07-13 – 2013-07-14 (×3): 100 mg via ORAL
  Filled 2013-07-13 (×4): qty 1

## 2013-07-13 MED ORDER — AMOXICILLIN-POT CLAVULANATE 875-125 MG PO TABS
1.0000 | ORAL_TABLET | Freq: Two times a day (BID) | ORAL | Status: DC
  Administered 2013-07-13 – 2013-07-14 (×3): 1 via ORAL
  Filled 2013-07-13 (×4): qty 1

## 2013-07-13 MED ORDER — ONDANSETRON HCL 4 MG/2ML IJ SOLN
INTRAMUSCULAR | Status: AC
  Filled 2013-07-13: qty 2

## 2013-07-13 MED ORDER — ONDANSETRON HCL 4 MG/2ML IJ SOLN
4.0000 mg | Freq: Four times a day (QID) | INTRAMUSCULAR | Status: DC | PRN
  Administered 2013-07-13: 4 mg via INTRAVENOUS

## 2013-07-13 NOTE — Progress Notes (Signed)
Patient ID: [name] [name], male   DOB: 1946/04/23, 67 y.o.   MRN: [id number] 4 Days Post-Op  Subjective: No complaints, buttocks sore, wife apparently came to ER last night and is still there to try and get placed  Objective: Vital signs in last 24 hours: Temp:  [98 F (36.7 C)-99.1 F (37.3 C)] 99.1 F (37.3 C) (08/01 0510) Pulse Rate:  [93-105] 96 (08/01 0510) Resp:  [18-20] 18 (08/01 0510) BP: (123-139)/(66-77) 123/66 mmHg (08/01 0510) SpO2:  [97 %-98 %] 98 % (08/01 0510) Last BM Date: 07/07/13  Intake/Output from previous day: 07/31 0701 - 08/01 0700 In: 1594.6 [P.O.:480; I.V.:314.6; IV Piggyback:800] Out: 2025 [Urine:2025] Intake/Output this shift: Total I/O In: -  Out: 500 [Urine:500]  General: alert. Oriented. Skin:  left gluteal wound clean around edges, base has beefy red tissue with min slough. No evidence of invasive infection or cellulitis.  Lab Results:  Results for orders placed during the hospital encounter of 07/08/13 (from the past 24 hour(s))  GLUCOSE, CAPILLARY     Status: Abnormal   Collection Time    07/12/13 12:11 PM      Result Value Range   Glucose-Capillary 206 (*) 70 - 99 mg/dL   Comment 1 Notify RN    GLUCOSE, CAPILLARY     Status: Abnormal   Collection Time    07/12/13  5:06 PM      Result Value Range   Glucose-Capillary 184 (*) 70 - 99 mg/dL   Comment 1 Notify RN    GLUCOSE, CAPILLARY     Status: Abnormal   Collection Time    07/12/13  9:48 PM      Result Value Range   Glucose-Capillary 210 (*) 70 - 99 mg/dL  BASIC METABOLIC PANEL     Status: Abnormal   Collection Time    07/13/13  5:25 AM      Result Value Range   Sodium 131 (*) 135 - 145 mEq/L   Potassium 3.5  3.5 - 5.1 mEq/L   Chloride 100  96 - 112 mEq/L   CO2 23  19 - 32 mEq/L   Glucose, Bld 239 (*) 70 - 99 mg/dL   BUN 13  6 - 23 mg/dL   Creatinine, Ser 4.69  0.50 - 1.35 mg/dL   Calcium 7.5 (*) 8.4 - 10.5 mg/dL   GFR calc non Af Amer 85 (*) >90 mL/min   GFR calc Af Amer >90   >90 mL/min  CBC     Status: Abnormal   Collection Time    07/13/13  5:25 AM      Result Value Range   WBC 16.0 (*) 4.0 - 10.5 K/uL   RBC 4.06 (*) 4.22 - 5.81 MIL/uL   Hemoglobin 11.6 (*) 13.0 - 17.0 g/dL   HCT 62.9 (*) 52.8 - 41.3 %   MCV 84.5  78.0 - 100.0 fL   MCH 28.6  26.0 - 34.0 pg   MCHC 33.8  30.0 - 36.0 g/dL   RDW 24.4  01.0 - 27.2 %   Platelets 324  150 - 400 K/uL  GLUCOSE, CAPILLARY     Status: Abnormal   Collection Time    07/13/13  8:01 AM      Result Value Range   Glucose-Capillary 204 (*) 70 - 99 mg/dL   Comment 1 Notify RN       Studies/Results: @RISRSLT24 @  . amoxicillin-clavulanate  1 tablet Oral Q12H  . Chlorhexidine Gluconate Cloth  6 each Topical Q0600  .  doxycycline  100 mg Oral Q12H  . heparin  5,000 Units Subcutaneous Q8H  . insulin aspart  0-15 Units Subcutaneous TID WC  . insulin glargine  10 Units Subcutaneous QHS  . mupirocin ointment  1 application Nasal BID  . polyethylene glycol  17 g Oral BID  . senna-docusate  1 tablet Oral BID     Assessment/Plan:  POD #4- Debridement soft tissue infection left gluteal area. This infection seems to be under control from a gluteal wound standpoint although his WBC is still 16. On vancomycin and Zosyn. Ortho doesn't believe elbow is source of leukocytosis, unknown etiology at this point  Significant leukocytosis- 16.0 today  Right elbow cellulitis. Being followed by orthopedics, they do not believe it is the source for his leukocytosis.    New diagnosis of diabetes mellitus. Has been transitioned off of insulin drip.  IM managing  Patient has multiple discharge planning needs due to him being the care giver for his wife, the patient's brother is willing to help with patient at home but they are unable to care for the patient's wife.  The wife is currently in the [facility] and CSW has received referral for placement.    LOS: 5 days  Jenny Lai 07/13/2013  .

## 2013-07-13 NOTE — Clinical Social Work Psychosocial (Signed)
     Clinical Social Work Department BRIEF PSYCHOSOCIAL ASSESSMENT 07/13/2013  Patient:  [name] [name], [name] [name]     Account Number:  [id number]     Admit date:  07/08/2013  Clinical Social Worker:  Hattie Perch  Date/Time:  07/13/2013 12:00 M  Referred by:  Physician  Date Referred:  07/13/2013 Referred for  SNF Placement   Other Referral:   Interview type:  Patient Other interview type:    PSYCHOSOCIAL DATA Living Status:  FAMILY Admitted from facility:   Level of care:   Primary support name:  Kedrick Mcnamee Primary support relationship to patient:  SPOUSE Degree of support available:   fair    CURRENT CONCERNS Current Concerns  Post-Acute Placement   Other Concerns:    SOCIAL WORK ASSESSMENT / PLAN CSW met with patient. Patient is alert and oriented X3. CSW had met with patient yesterday to assist with difficulties regarding patient's spouse. patient care for patient full time and cant currently because of being in the hospital and medical condition. Patient's friends are caring for spouse and we tried to get her placed back in [facility] in [place] yesterday but last night friends brought spouse to emergency room. CSW Catha Gosselin is currently trying to place spouse from the ED but is limited due to patient no longer having medicaid and may or may not be out of medicare days. CSW met with patient in regards to MD thinking patient needs SNF upon discharge. patient states that he won't be going to snf wether or not his spouse does. If spouse goes to snf, he will go home with home health and if she doesnt, he states that he has to go home and care for her anyway. Patient is frustrated at CSW's limitations at assisting his wife. He states that they are unable to private pay and he is unsure why her medicaid lapsed (it appears it was only active while she was in [facility] during may and june). CSW attempted to offer emotional support and try to explain things to patient  but he was not very receptive and clearly expressed his frustration.   Assessment/plan status:   Other assessment/ plan:   Information/referral to community resources:    PATIENTS/FAMILYS RESPONSE TO PLAN OF CARE: patient adamantly refusing snf and is very upset that his wife is unable to get help.

## 2013-07-13 NOTE — Progress Notes (Signed)
Talked to the patient several times yesterday & today regarding watching the diabetes education videos & going over the book & take home kit.  He states that he already knows how and it wasn't a good time for him. Yesterday waited till his brother got to his room so that both could watch the videos together.  At that time they both stated they would not be ready this evening to watch any of the videos.

## 2013-07-13 NOTE — Progress Notes (Signed)
[facility] PROGRESS NOTE  [name] [name] ZOX:[id number] DOB: Nov 12, 1946 DOA: 07/08/2013 PCP: No primary provider on file.  Assessment/Plan: Left Gluteal Abscess -Will transition to PO antibiotics today. -S/p I and D of abscess 7/29.  Right Elbow Cellulitis -Seen by ortho. -They do not believe this is septic arthritis, but more of a cellulitis. -Improved per ortho report. -Plan to continue current abx.  Sepsis -Presumed 2/2 above. -All cx data remains negative to date. -Repeat blood cx, U/A, urine cx and CXR all negative so far. -CT abd/pelvis 7/28 negative except for gluteal abscess findings. -Leukocytosis stable. -Has been afebrile overnight. -Will be transition to PO augmentin and doxy today.  HONK -Resolved. -CBGs with fair control. -New DM diagnosis. -Will need to go home on insulin. -Is not interested in insulin teaching. Also refuses to watch the education videos on DM.  Hyponatremia -Follow. -Initially thought to be false related to hyperglycemia. -Slowly correcting.  Leukocytosis -Stable -See above for details.  Code Status: Full code Family Communication: Patient only  Disposition Plan: Anticipate home in 24 hours.   Consultants:  Ortho  Gen Surgery   Antibiotics:  Augmentin  Doxy  Subjective: No complaints today.  Objective: Filed Vitals:   07/12/13 0510 07/12/13 1439 07/12/13 2100 07/13/13 0510  BP: 128/70 139/77 132/67 123/66  Pulse: 96 105 93 96  Temp: 98.8 F (37.1 C) 98 F (36.7 C) 98.8 F (37.1 C) 99.1 F (37.3 C)  TempSrc: Oral Oral Oral Oral  Resp: 20 20 20 18   Height:      Weight:      SpO2: 96% 97% 97% 98%    Intake/Output Summary (Last 24 hours) at 07/13/13 1225 Last data filed at 07/13/13 0717  Gross per 24 hour  Intake    240 ml  Output   2225 ml  Net  -1985 ml   Filed Weights   07/08/13 2135  Weight: 77.111 kg (170 lb)    Exam:   General:  AA Ox3  Cardiovascular: RRR, no M/R/G  Respiratory:  CTA B  Abdomen: S/NT/ND/+BS/no masses or organomegaly noted.  Extremities: Right elbow in ACE wrap, no C/C/E/+pedal pulses   Neurologic:  Grossly intact and nonfocal.  Data Reviewed: Basic Metabolic Panel:  Recent Labs Lab 07/10/13 2102 07/10/13 2330 07/11/13 0337 07/12/13 0505 07/13/13 0525  NA 126* 127* 125* 129* 131*  K 3.7 3.7 3.4* 3.8 3.5  CL 95* 96 95* 96 100  CO2 21 21 21 23 23   GLUCOSE 123* 169* 176* 215* 239*  BUN 14 15 15 16 13   CREATININE 0.95 1.02 0.99 1.02 0.95  CALCIUM 7.6* 7.8* 7.9* 7.5* 7.5*   Liver Function Tests:  Recent Labs Lab 07/08/13 2152 07/09/13 0530  AST 17 20  ALT 16 14  ALKPHOS 157* 131*  BILITOT 0.3 0.4  PROT 6.4 6.1  ALBUMIN 2.5* 2.3*   No results found for this basename: LIPASE, AMYLASE,  in the last 168 hours No results found for this basename: AMMONIA,  in the last 168 hours CBC:  Recent Labs Lab 07/08/13 2152  07/10/13 0334 07/10/13 1025 07/11/13 0337 07/12/13 0505 07/13/13 0525  WBC 20.1*  < > 21.4* 21.8* 24.5* 15.9* 16.0*  NEUTROABS 17.9*  --   --   --  21.5*  --   --   HGB 13.8  < > 11.8* 12.0* 13.2 11.8* 11.6*  HCT 39.8  < > 33.3* 34.9* 39.3 34.5* 34.3*  MCV 82.7  < > 84.3 84.9 85.6 85.4 84.5  PLT  344  < > 311 308 336 323 324  < > = values in this interval not displayed. Cardiac Enzymes: No results found for this basename: CKTOTAL, CKMB, CKMBINDEX, TROPONINI,  in the last 168 hours BNP (last 3 results) No results found for this basename: PROBNP,  in the last 8760 hours CBG:  Recent Labs Lab 07/12/13 0718 07/12/13 1211 07/12/13 1706 07/12/13 2148 07/13/13 0801  GLUCAP 210* 206* 184* 210* 204*    Recent Results (from the past 240 hour(s))  URINE CULTURE     Status: None   Collection Time    07/08/13 10:56 PM      Result Value Range Status   Specimen Description URINE, CLEAN CATCH   Final   Special Requests NONE   Final   Culture  Setup Time 07/09/2013 11:37   Final   Colony Count >=100,000  COLONIES/ML   Final   Culture     Final   Value: GROUP B STREP(S.AGALACTIAE)ISOLATED     Note: TESTING AGAINST S. AGALACTIAE NOT ROUTINELY PERFORMED DUE TO PREDICTABILITY OF AMP/PEN/VAN SUSCEPTIBILITY.   Report Status 07/10/2013 FINAL   Final  CULTURE, BLOOD (ROUTINE X 2)     Status: None   Collection Time    07/08/13 11:50 PM      Result Value Range Status   Specimen Description BLOOD LEFT FEMORAL ARTERY   Final   Special Requests BOTTLES DRAWN AEROBIC ONLY   Final   Culture  Setup Time 07/09/2013 10:38   Final   Culture     Final   Value:        BLOOD CULTURE RECEIVED NO GROWTH TO DATE CULTURE WILL BE HELD FOR 5 DAYS BEFORE ISSUING A FINAL NEGATIVE REPORT   Report Status PENDING   Incomplete  CULTURE, BLOOD (ROUTINE X 2)     Status: None   Collection Time    07/09/13 12:10 AM      Result Value Range Status   Specimen Description BLOOD LEFT ARM   Final   Special Requests BOTTLES DRAWN AEROBIC AND ANAEROBIC   Final   Culture  Setup Time 07/09/2013 10:42   Final   Culture     Final   Value:        BLOOD CULTURE RECEIVED NO GROWTH TO DATE CULTURE WILL BE HELD FOR 5 DAYS BEFORE ISSUING A FINAL NEGATIVE REPORT   Report Status PENDING   Incomplete  MRSA PCR SCREENING     Status: Abnormal   Collection Time    07/09/13  1:30 AM      Result Value Range Status   MRSA by PCR POSITIVE (*) NEGATIVE Final   Comment:            The GeneXpert MRSA Assay (FDA     approved for NASAL specimens     only), is one component of a     comprehensive MRSA colonization     surveillance program. It is not     intended to diagnose MRSA     infection nor to guide or     monitor treatment for     MRSA infections.     RESULT CALLED TO, READ BACK BY AND VERIFIED WITH:     STERN,R/2W @0429  ON 07/09/13 BY KARCZEWSKI,S.  CULTURE, BLOOD (ROUTINE X 2)     Status: None   Collection Time    07/11/13 12:15 PM      Result Value Range Status   Specimen Description BLOOD LEFT HAND  Final   Special  Requests BOTTLES DRAWN AEROBIC ONLY 1CC   Final   Culture  Setup Time 07/11/2013 14:34   Final   Culture     Final   Value:        BLOOD CULTURE RECEIVED NO GROWTH TO DATE CULTURE WILL BE HELD FOR 5 DAYS BEFORE ISSUING A FINAL NEGATIVE REPORT   Report Status PENDING   Incomplete  CULTURE, BLOOD (ROUTINE X 2)     Status: None   Collection Time    07/11/13 12:25 PM      Result Value Range Status   Specimen Description BLOOD LEFT ARM   Final   Special Requests BOTTLES DRAWN AEROBIC AND ANAEROBIC 4CC   Final   Culture  Setup Time 07/11/2013 14:34   Final   Culture     Final   Value:        BLOOD CULTURE RECEIVED NO GROWTH TO DATE CULTURE WILL BE HELD FOR 5 DAYS BEFORE ISSUING A FINAL NEGATIVE REPORT   Report Status PENDING   Incomplete  URINE CULTURE     Status: None   Collection Time    07/11/13 12:55 PM      Result Value Range Status   Specimen Description URINE, CLEAN CATCH   Final   Special Requests NONE   Final   Culture  Setup Time 07/12/2013 03:47   Final   Colony Count NO GROWTH   Final   Culture NO GROWTH   Final   Report Status 07/13/2013 FINAL   Final     Studies: No results found.  Scheduled Meds: . amoxicillin-clavulanate  1 tablet Oral Q12H  . Chlorhexidine Gluconate Cloth  6 each Topical Q0600  . doxycycline  100 mg Oral Q12H  . heparin  5,000 Units Subcutaneous Q8H  . insulin aspart  0-15 Units Subcutaneous TID WC  . insulin glargine  10 Units Subcutaneous QHS  . mupirocin ointment  1 application Nasal BID  . polyethylene glycol  17 g Oral BID  . senna-docusate  1 tablet Oral BID   Continuous Infusions: . sodium chloride 125 mL/hr at 07/12/13 2117    Principal Problem:   Sepsis Active Problems:   Hyperosmolar (nonketotic) coma   Abscess, gluteal, right   Tachycardia    Time spent: 45 minutes    [name] [name],[name] [name]  [facility] Pager [phone]  If 7PM-7AM, please contact night-coverage at [email or web address], password [facility] 07/13/2013, 12:25  PM  LOS: 5 days

## 2013-07-13 NOTE — Progress Notes (Addendum)
Inpatient Diabetes Program Recommendations  AACE/ADA: New Consensus Statement on Inpatient Glycemic Control (2013)  Target Ranges:  Prepandial:   less than 140 mg/dL      Peak postprandial:   less than 180 mg/dL (1-2 hours)      Critically ill patients:  140 - 180 mg/dL   Patient is currently refusing insulin education.   Consider starting oral antihyperglycemics.  ADDENDUM: this coordinator spoke with patient again regarding insulin education.  Stressed importance of insulin for healing and to prevent DKA since patient did go into DKA this admission. Patient is now open to giving himself insulin and says that he will self admin tonight at regularly scheduled dose.  RN informed of his decision.  Will ask case management if patient is a candidate for the [facility] program for insulin at discharge.  Patient reports he will follow up at the [place] in either [place] or [place] ASAP.  Thank you  Piedad Climes BSN, RN,CDE Inpatient Diabetes Coordinator [phone] (team pager)

## 2013-07-13 NOTE — Progress Notes (Signed)
Pt was agreeable to watch a few of the educational videos on DM this evening. Pt has watched videos [phone] tonight and stated he will watch the others in the morning.

## 2013-07-13 NOTE — Progress Notes (Signed)
MEDICATION RELATED CONSULT NOTE   Pharmacy Consult for:  Review current medications as a potential etiology for persistent leukocytosis   No Known Allergies  Patient Measurements: Height: 5\' 11"  (180.3 cm) Weight: 170 lb (77.111 kg) IBW/kg (Calculated) : 75.3   Vital Signs: Temp: 99.1 F (37.3 C) (08/01 0510) Temp src: Oral (08/01 0510) BP: 123/66 mmHg (08/01 0510) Pulse Rate: 96 (08/01 0510) Intake/Output from previous day: 07/31 0701 - 08/01 0700 In: 1594.6 [P.O.:480; I.V.:314.6; IV Piggyback:800] Out: 2025 [Urine:2025] Intake/Output from this shift: Total I/O In: -  Out: 500 [Urine:500]  Labs:  Recent Labs  07/11/13 0337 07/12/13 0505 07/13/13 0525  WBC 24.5* 15.9* 16.0*  HGB 13.2 11.8* 11.6*  HCT 39.3 34.5* 34.3*  PLT 336 323 324  CREATININE 0.99 1.02 0.95  Diff on 7/30: 88% neutrophils 6% lymphocytes 6% monocytes 1% eosinophils  Medical History: DM newly diagnosed this admission  Medications:  Scheduled:  . amoxicillin-clavulanate  1 tablet Oral Q12H  . Chlorhexidine Gluconate Cloth  6 each Topical Q0600  . doxycycline  100 mg Oral Q12H  . heparin  5,000 Units Subcutaneous Q8H  . insulin aspart  0-15 Units Subcutaneous TID WC  . insulin glargine  10 Units Subcutaneous QHS  . mupirocin ointment  1 application Nasal BID  . polyethylene glycol  17 g Oral BID  . senna-docusate  1 tablet Oral BID   Infusions:  . sodium chloride 125 mL/hr at 07/12/13 2117   PRN: dextrose, HYDROmorphone (DILAUDID) injection, zolpidem  Assessment: 67 y/o M  with NKDA showing clinical improvement on antibiotics (IV Zosyn + vancomycin transitioned to PO Augmentin + doxycycline) for soft issue infection of L gluteal area but with persistent leukocytosis.  Was asked by surgery to review medications as a possible etiology of this persistently abnormal lab finding.  Comment: The only medication listed on patient's PTA med list is ASA, so almost all the medications  administered in hospital are new exposures. However, none of the patient's current or recent inpatient medications are commonly associated with leukocytosis.  Additionally, the absence of eosinophilia and lack of any signs or symptoms of hypersensitivity reduce the likelihood of an allergic medication reaction manifesting as leukocytosis.   The only way to definitively rule out medications as an etiology, however, would be to sequentially stop them, observe the response, and consider rechallenging if a suspected agent is identified.    Summary: Medication-induced leukocytosis appears unlikely in this patient.  Elie Goody, PharmD, BCPS Pager: [phone] 07/13/2013  9:44 AM

## 2013-07-13 NOTE — Progress Notes (Signed)
Patient interviewed and examined this morning. I agree with the assessment and treatment plan outlined by Doristine Mango, PA.  Left gluteal wound is very clean and is appropriate for outpatient management, so long as we can get home health nursing and good compliance for twice a day dressing changes. Patient has a lot of social issues. Wife is chronically ill and now, today, acutely ill and in the emergency department. I have concerns about compliance with wound care at home.   Angelia Mould. Derrell Lolling, M.D., [facility] General and Minimally invasive Surgery Breast and Colorectal Surgery Office:   [phone] Pager:   [phone]

## 2013-07-14 MED ORDER — AMOXICILLIN-POT CLAVULANATE 875-125 MG PO TABS: 1.0000 | ORAL_TABLET | Freq: Two times a day (BID) | ORAL | Status: AC

## 2013-07-14 MED ORDER — LOPERAMIDE HCL 2 MG PO CAPS
2.0000 mg | ORAL_CAPSULE | ORAL | Status: DC | PRN
  Administered 2013-07-14: 2 mg via ORAL
  Filled 2013-07-14: qty 1

## 2013-07-14 MED ORDER — DOXYCYCLINE HYCLATE 100 MG PO TABS: 100.0000 mg | ORAL_TABLET | Freq: Two times a day (BID) | ORAL | Status: AC

## 2013-07-14 MED ORDER — INSULIN GLARGINE 100 UNIT/ML ~~LOC~~ SOLN: 10.0000 [IU] | Freq: Every day | SUBCUTANEOUS | Status: AC

## 2013-07-14 NOTE — Care Management Note (Signed)
    Page 1 of 2   07/14/2013     3:48:41 PM   CARE MANAGEMENT NOTE 07/14/2013  Patient:  [name] [name]   Account Number:  [id number]  Date Initiated:  07/09/2013  Documentation initiated by:  DAVIS,RHONDA  Subjective/Objective Assessment:   pt admitted from earlier visit to [facility] due to buttock abcess found to be new diabetic admitted to [facility].     Action/Plan:   home when stable will need wound and diabetic teaching.   Anticipated DC Date:  07/14/2013   Anticipated DC Plan:  HOME W HOME HEALTH SERVICES  In-house referral  NA      DC Planning Services  CM consult      [facility] Choice  NA   Choice offered to / List presented to:  NA   DME arranged  NA      DME agency  NA     HH arranged  NA      HH agency  NA   Status of service:  In process, will continue to follow Medicare Important Message given?  NA - LOS <3 / Initial given by admissions (If response is "NO", the following Medicare IM given date fields will be blank) Date Medicare IM given:   Date Additional Medicare IM given:    Discharge Disposition:  HOME/SELF CARE  Per UR Regulation:  Reviewed for med. necessity/level of care/duration of stay  If discussed at Long Length of Stay Meetings, dates discussed:    Comments:  07/14/13 [name] Woodrome RN,BSN NCM WEEKEND [phone] NO HH NEEDS OR ORDERS.  07/12/13 [name] MIRINGU RN BSN Met with pt at bedside to discuss [facility] needs. He stated he is the primary caregiver for his wife and she is being cared for by friends. CSW has guided a friend on how to get he placed since he can't care for her even after d/c.  Pt informed me that he only has Medicate Part A and not part B as well as our record show. I spoke with admitting and this was corrected. He has VA benefits, I called them to check on his [facility] services benefit. I was informed he does not have any at this time because he is not established with any of their providers VA will not pay for his prescriptions either until he is seen  by them. I was provided with the # for pt to call and make an appointment as soon as possible. I discussed this with the pt and he verbalized understanding.  I have requested the bedside RN to provide thorough diabetes teaching and wound care teaching here in the hospital since [facility] services are not an option at thsi time.

## 2013-07-14 NOTE — Discharge Summary (Signed)
Physician Discharge Summary  Obaloluwa Delatte OVF:[id number] DOB: 01/25/1946 DOA: 07/08/2013  PCP: No primary provider on file.  Admit date: 07/08/2013 Discharge date: 07/14/2013  Time spent: 45 minutes  Recommendations for Outpatient Follow-up:  -Advised to assume care at the [facility] next week. He verbalizes understanding of making this appointment. -Will also need to follow up with ortho, office number left with patient.   Discharge Diagnoses:  Principal Problem:   Sepsis Active Problems:   Hyperosmolar (nonketotic) coma   Abscess, gluteal, right   Tachycardia   Discharge Condition: Stable and improved  Filed Weights   07/08/13 2135  Weight: 77.111 kg (170 lb)    History of present illness:  Patient is a 67 y.o. male with no known medical illnesses, who presented to [facility] emergency room 7/27 because with bleeding from an area on his buttocks. He, states he's had some pain and some discomfort in his buttocks. Over the past week and then subsequently developed bleeding from the buttock area and decided to seek medical attention.  He states over the past week or so. He has had a very poor appetite, but increasing thirst. He states over the past, month he's felt thirsty to the point that he cannot get enough to drink. He did not think anything of this.  When he was seen in the emergency room, he had incision and drainage of a 5 x 7 cm abscess on his right upper gluteal muscle, but was noted to also have a random blood sugar in the 700s-he's never been told he is diabetic and he has never seen a doctor for a while. We were asked to admit him for further evaluation and management.   Hospital Course:   Left Gluteal Abscess  -Has been transitioned to PO doxy and augmentin to complete 14 more days of treatment.  -S/p I and D of abscess 7/29.  -CCS is recommending BID dressing changes. Patient's brother has been instructed on how to perform.  Right Elbow Cellulitis  -Seen by ortho.  -They  do not believe this is septic arthritis, but more of a cellulitis.  -Improved per ortho report.  -Plan to continue current abx.   Sepsis  -Presumed 2/2 above.  -All cx data remains negative to date.  -Repeat blood cx, U/A, urine cx and CXR all negative..  -CT abd/pelvis 7/28 negative except for gluteal abscess findings.  -Leukocytosis stable.  -Has been afebrile overnight.  -Will be transition to PO augmentin and doxy today.   HONK  -Resolved.  -CBGs with fair control.  -New DM diagnosis.  -Will need to go home on insulin.   Hyponatremia  -Follow.  -Initially thought to be false related to hyperglycemia.  -Slowly correcting. 131 on DC  Leukocytosis  -Stable at around 15. -See above for details.   Procedures:  Left gluteal I and D 7/29.   Consultations:  CCS  Ortho  Discharge Instructions  Discharge Orders   Future Orders Complete By Expires     Discontinue IV  As directed     Increase activity slowly  As directed         Medication List         amoxicillin-clavulanate 875-125 MG per tablet  Commonly known as:  AUGMENTIN  Take 1 tablet by mouth every 12 (twelve) hours. For 14 days     aspirin 81 MG chewable tablet  Chew 162 mg by mouth daily.     doxycycline 100 MG tablet  Commonly known as:  VIBRA-TABS  Take 1 tablet (100 mg total) by mouth every 12 (twelve) hours. For 14 days     insulin glargine 100 UNIT/ML injection  Commonly known as:  LANTUS  Inject 0.1 mLs (10 Units total) into the skin at bedtime.       No Known Allergies     Follow-up Information   Please follow up. (Physician at the [facility] next week)       Follow up with HANDY,MICHAEL H, MD. Schedule an appointment as soon as possible for a visit in 2 weeks.   Contact information:   [address] Jaclyn [place] [address] [phone]       Follow up with Ernestene Mention, MD. Schedule an appointment as soon as possible for a visit in 2 weeks.    Contact information:   [address] [place] [address] [phone]        The results of significant diagnostics from this hospitalization (including imaging, microbiology, ancillary and laboratory) are listed below for reference.    Significant Diagnostic Studies: Dg Chest 2 View  07/10/2013   *RADIOLOGY REPORT*  Clinical Data: Leukocytosis  CHEST - 2 VIEW  Comparison: None.  Findings: Cardiomegaly is noted.  No acute infiltrate or pulmonary edema.  Elevation of the right hemidiaphragm.  Question trace right pleural effusion.  Thoracic spine osteopenia.  IMPRESSION: No acute infiltrate or pulmonary edema.  Elevation of the right hemidiaphragm.  Cardiomegaly.  Question trace right pleural effusion.   Original Report Authenticated By: Natasha Mead, M.D.   Dg Elbow Complete Right  07/09/2013   *RADIOLOGY REPORT*  Clinical Data: Right elbow swelling  RIGHT ELBOW - COMPLETE 3+ VIEW  Comparison: None.  Findings: No definite fracture is noted.  Abnormal anterior fat pad displacement is noted suggesting underlying joint effusion.  No dislocation is noted.  Joint spaces appear otherwise intact.  IMPRESSION: Abnormal anterior fat pad displacement is noted suggesting underlying joint effusion.  No definite fracture or dislocation is noted.   Original Report Authenticated By: Lupita Raider.,  M.D.   Ct Pelvis W Contrast  07/09/2013   *RADIOLOGY REPORT*  Clinical Data:  Bleeding left buttock abscess.  CT PELVIS WITH CONTRAST  Technique:  Multidetector CT imaging of the pelvis was performed using the standard protocol following the bolus administration of intravenous contrast.  Contrast: OMNIPAQUE IOHEXOL 300 MG/ML  SOLN  Comparison:   None.  Findings:  Subcutaneous inflammation in the superficial soft tissues overlying the left gluteal musculature present with a small foci of air present internally.  Fairly large area of inflammation measures approximately 2.2 cm in depth, 9 cm in width  and 9.6 cm in craniocaudad height.  Internal density is consistent with complex fluid and the area does not show discrete areas of fluid density. No soft tissue foreign body is seen.  The rest of the pelvis is unremarkable and there is no evidence of intraperitoneal or retroperitoneal inflammation within the pelvis. Visualized bowel loops and bladder are unremarkable.  No bony abnormalities are seen.  IMPRESSION: Inflammatory process of the left buttocks in the superficial soft tissues.  This is largely an area of complex inflammation without discrete liquefied abscess.  Small foci of air are present in the area of inflammation.  There is no obvious extension into the deeper soft tissues.   Original Report Authenticated By: Irish Lack, M.D.   Dg Chest Port 1 View  07/11/2013   *RADIOLOGY REPORT*  Clinical Data: Fever,  leukocytosis, possible pneumonia  PORTABLE CHEST - 1 VIEW  Comparison: 07/10/2013  Findings: Mildly enlarged cardiac silhouette.  Poor inspiratory effect.  There appears to be vascular congestion likely exaggerated or entirely accounted for  by poor inspiration.  There is mild medial lower lobe atelectasis on the right.  IMPRESSION: Poor  inspiratory effect.  Allowing for this, no acute findings. If concern for pneumonia, PA and lateral study or CT thorax suggested if symptoms persist.   Original Report Authenticated By: Esperanza Heir, M.D.    Microbiology: Recent Results (from the past 240 hour(s))  URINE CULTURE     Status: None   Collection Time    07/08/13 10:56 PM      Result Value Range Status   Specimen Description URINE, CLEAN CATCH   Final   Special Requests NONE   Final   Culture  Setup Time 07/09/2013 11:37   Final   Colony Count >=100,000 COLONIES/ML   Final   Culture     Final   Value: GROUP B STREP(S.AGALACTIAE)ISOLATED     Note: TESTING AGAINST S. AGALACTIAE NOT ROUTINELY PERFORMED DUE TO PREDICTABILITY OF AMP/PEN/VAN SUSCEPTIBILITY.   Report Status 07/10/2013  FINAL   Final  CULTURE, BLOOD (ROUTINE X 2)     Status: None   Collection Time    07/08/13 11:50 PM      Result Value Range Status   Specimen Description BLOOD LEFT FEMORAL ARTERY   Final   Special Requests BOTTLES DRAWN AEROBIC ONLY   Final   Culture  Setup Time 07/09/2013 10:38   Final   Culture     Final   Value:        BLOOD CULTURE RECEIVED NO GROWTH TO DATE CULTURE WILL BE HELD FOR 5 DAYS BEFORE ISSUING A FINAL NEGATIVE REPORT   Report Status PENDING   Incomplete  CULTURE, BLOOD (ROUTINE X 2)     Status: None   Collection Time    07/09/13 12:10 AM      Result Value Range Status   Specimen Description BLOOD LEFT ARM   Final   Special Requests BOTTLES DRAWN AEROBIC AND ANAEROBIC   Final   Culture  Setup Time 07/09/2013 10:42   Final   Culture     Final   Value:        BLOOD CULTURE RECEIVED NO GROWTH TO DATE CULTURE WILL BE HELD FOR 5 DAYS BEFORE ISSUING A FINAL NEGATIVE REPORT   Report Status PENDING   Incomplete  MRSA PCR SCREENING     Status: Abnormal   Collection Time    07/09/13  1:30 AM      Result Value Range Status   MRSA by PCR POSITIVE (*) NEGATIVE Final   Comment:            The GeneXpert MRSA Assay (FDA     approved for NASAL specimens     only), is one component of a     comprehensive MRSA colonization     surveillance program. It is not     intended to diagnose MRSA     infection nor to guide or     monitor treatment for     MRSA infections.     RESULT CALLED TO, READ BACK BY AND VERIFIED WITH:     STERN,R/2W @0429  ON 07/09/13 BY KARCZEWSKI,S.  CULTURE, BLOOD (ROUTINE X 2)     Status: None   Collection Time    07/11/13 12:15 PM  Result Value Range Status   Specimen Description BLOOD LEFT HAND   Final   Special Requests BOTTLES DRAWN AEROBIC ONLY 1CC   Final   Culture  Setup Time 07/11/2013 14:34   Final   Culture     Final   Value:        BLOOD CULTURE RECEIVED NO GROWTH TO DATE CULTURE WILL BE HELD FOR 5 DAYS BEFORE ISSUING A FINAL NEGATIVE  REPORT   Report Status PENDING   Incomplete  CULTURE, BLOOD (ROUTINE X 2)     Status: None   Collection Time    07/11/13 12:25 PM      Result Value Range Status   Specimen Description BLOOD LEFT ARM   Final   Special Requests BOTTLES DRAWN AEROBIC AND ANAEROBIC 4CC   Final   Culture  Setup Time 07/11/2013 14:34   Final   Culture     Final   Value:        BLOOD CULTURE RECEIVED NO GROWTH TO DATE CULTURE WILL BE HELD FOR 5 DAYS BEFORE ISSUING A FINAL NEGATIVE REPORT   Report Status PENDING   Incomplete  URINE CULTURE     Status: None   Collection Time    07/11/13 12:55 PM      Result Value Range Status   Specimen Description URINE, CLEAN CATCH   Final   Special Requests NONE   Final   Culture  Setup Time 07/12/2013 03:47   Final   Colony Count NO GROWTH   Final   Culture NO GROWTH   Final   Report Status 07/13/2013 FINAL   Final     Labs: Basic Metabolic Panel:  Recent Labs Lab 07/10/13 2102 07/10/13 2330 07/11/13 0337 07/12/13 0505 07/13/13 0525  NA 126* 127* 125* 129* 131*  K 3.7 3.7 3.4* 3.8 3.5  CL 95* 96 95* 96 100  CO2 21 21 21 23 23   GLUCOSE 123* 169* 176* 215* 239*  BUN 14 15 15 16 13   CREATININE 0.95 1.02 0.99 1.02 0.95  CALCIUM 7.6* 7.8* 7.9* 7.5* 7.5*   Liver Function Tests:  Recent Labs Lab 07/08/13 2152 07/09/13 0530  AST 17 20  ALT 16 14  ALKPHOS 157* 131*  BILITOT 0.3 0.4  PROT 6.4 6.1  ALBUMIN 2.5* 2.3*   No results found for this basename: LIPASE, AMYLASE,  in the last 168 hours No results found for this basename: AMMONIA,  in the last 168 hours CBC:  Recent Labs Lab 07/08/13 2152  07/10/13 0334 07/10/13 1025 07/11/13 0337 07/12/13 0505 07/13/13 0525  WBC 20.1*  < > 21.4* 21.8* 24.5* 15.9* 16.0*  NEUTROABS 17.9*  --   --   --  21.5*  --   --   HGB 13.8  < > 11.8* 12.0* 13.2 11.8* 11.6*  HCT 39.8  < > 33.3* 34.9* 39.3 34.5* 34.3*  MCV 82.7  < > 84.3 84.9 85.6 85.4 84.5  PLT 344  < > 311 308 336 323 324  < > = values in this  interval not displayed. Cardiac Enzymes: No results found for this basename: CKTOTAL, CKMB, CKMBINDEX, TROPONINI,  in the last 168 hours BNP: BNP (last 3 results) No results found for this basename: PROBNP,  in the last 8760 hours CBG:  Recent Labs Lab 07/13/13 1252 07/13/13 1629 07/13/13 2114 07/14/13 0736 07/14/13 1136  GLUCAP 180* 161* 209* 139* 170*       Signed:  HERNANDEZ ACOSTA,ESTELA  [facility] Pager: [phone] 07/14/2013, 12:25  PM

## 2013-07-14 NOTE — Progress Notes (Signed)
Patient ID: [name] [name], male   DOB: [birth year], 67 y.o.   MRN: [id number] 5 Days Post-Op  Subjective: Right shoulder weakness and discomfort is his main complaint. This has been evaluated by orthopedics. States left buttock wound is feeling better.  Objective: Vital signs in last 24 hours: Temp:  [97.7 F (36.5 C)-98.8 F (37.1 C)] 98.5 F (36.9 C) (08/02 0919) Pulse Rate:  [94-109] 103 (08/02 0919) Resp:  [16-18] 16 (08/02 0919) BP: (120-140)/(65-71) 125/65 mmHg (08/02 0919) SpO2:  [94 %-97 %] 95 % (08/02 0919) Last BM Date: 07/14/13  Intake/Output from previous day: 08/01 0701 - 08/02 0700 In: 5740 [P.O.:240; I.V.:5500] Out: 2100 [Urine:2100] Intake/Output this shift: Total I/O In: 240 [P.O.:240] Out: 300 [Urine:300]  General appearance: chronically ill-appearing male in no distress Incision/Wound: dressing changed on buttock wound. Mostly clean granulation tissue minimal exudate. No cellulitis or unusual drainage.  Lab Results:   Recent Labs  07/12/13 0505 07/13/13 0525  WBC 15.9* 16.0*  HGB 11.8* 11.6*  HCT 34.5* 34.3*  PLT 323 324   BMET  Recent Labs  07/12/13 0505 07/13/13 0525  NA 129* 131*  K 3.8 3.5  CL 96 100  CO2 23 23  GLUCOSE 215* 239*  BUN 16 13  CREATININE 1.02 0.95  CALCIUM 7.5* 7.5*     Studies/Results: No results found.  Anti-infectives: Anti-infectives   Start     Dose/Rate Route Frequency Ordered Stop   07/14/13 0000  amoxicillin-clavulanate (AUGMENTIN) 875-125 MG per tablet     1 tablet Oral Every 12 hours 07/14/13 0920     07/14/13 0000  doxycycline (VIBRA-TABS) 100 MG tablet     100 mg Oral Every 12 hours 07/14/13 0920     07/13/13 1000  doxycycline (VIBRA-TABS) tablet 100 mg     100 mg Oral Every 12 hours 07/13/13 0733     07/13/13 1000  amoxicillin-clavulanate (AUGMENTIN) 875-125 MG per tablet 1 tablet     1 tablet Oral Every 12 hours 07/13/13 0733     07/09/13 0930  piperacillin-tazobactam (ZOSYN) IVPB 3.375 g  Status:   Discontinued     3.375 g 12.5 mL/hr over 240 Minutes Intravenous Every 8 hours 07/09/13 0819 07/13/13 0733   07/09/13 0830  vancomycin (VANCOCIN) IVPB 750 mg/150 ml premix  Status:  Discontinued     750 mg 150 mL/hr over 60 Minutes Intravenous Every 8 hours 07/09/13 0819 07/13/13 0733   07/08/13 2300  vancomycin (VANCOCIN) IVPB 1000 mg/200 mL premix     1,000 mg 200 mL/hr over 60 Minutes Intravenous  Once 07/08/13 2257 07/09/13 0133   07/08/13 2300  piperacillin-tazobactam (ZOSYN) IVPB 3.375 g     3.375 g 100 mL/hr over 30 Minutes Intravenous  Once 07/08/13 2257 07/09/13 0034      Assessment/Plan: s/p Procedure(s): IRRIGATION AND DEBRIDEMENT ABSCESS LEFT BUTTOCKS Wound is doing well with current care. White count remains elevated but may not be related to his wound as it looks very clean. We will check again on Monday   LOS: 6 days    Leatta Alewine T 07/14/2013

## 2013-07-14 NOTE — Progress Notes (Signed)
Pt ready for d/c. Went over d/c instructions with pt. Educated pt on signs and symptoms of hypo/hyperglycemia and how to treat. Instructed to take lantus at night. Instructed on importance of continuing antibiotics and monitoring blood sugar levels. PIV removed, WNL. No change in pt condition since AM assessment. Pt d/c'd to home with brother. Eugene Garnet RN

## 2013-07-14 NOTE — Progress Notes (Addendum)
Pt's brother demonstrated correct technique for insulin injections. Pt's brother observed surgeon change dressing and states understanding for procedure. Eugene Garnet RN

## 2013-07-14 NOTE — Progress Notes (Signed)
Encouraged pt to self administer nighttime dose of lantus. However, pt verbalized that he could not administer injection tonight due to weakness and limited mobility and strength in R arm. This RN gave injection while providing education on how to give injection and the importance of rotating sites with each injection.

## 2013-07-15 LAB — CULTURE, BLOOD (ROUTINE X 2): Culture: NO GROWTH

## 2013-07-17 LAB — CULTURE, BLOOD (ROUTINE X 2): Culture: NO GROWTH

## 2015-02-28 ENCOUNTER — Other Ambulatory Visit: Payer: Self-pay

## 2015-03-01 ENCOUNTER — Other Ambulatory Visit: Payer: Self-pay | Admitting: Internal Medicine

## 2015-03-07 ENCOUNTER — Inpatient Hospital Stay
Admit: 2015-03-07 | Disposition: A | Discharge status: Home / Self Care | Payer: Self-pay | Attending: Internal Medicine | Admitting: Internal Medicine

## 2015-03-07 LAB — URINALYSIS, COMPLETE
BILIRUBIN, UR: NEGATIVE
BLOOD: NEGATIVE
Glucose,UR: NEGATIVE mg/dL (ref 0–75)
Hyaline Cast: 483
KETONE: NEGATIVE
Nitrite: NEGATIVE
PH: 5 (ref 4.5–8.0)
RBC,UR: 242 /HPF (ref 0–5)
Specific Gravity: 1.033 (ref 1.003–1.030)
Squamous Epithelial: 20
WBC UR: 123 /HPF (ref 0–5)

## 2015-03-07 LAB — COMPREHENSIVE METABOLIC PANEL
ALK PHOS: 221 U/L — AB
ALT: 119 U/L — AB
Albumin: 3.3 g/dL — ABNORMAL LOW
Anion Gap: 11 (ref 7–16)
BILIRUBIN TOTAL: 1.3 mg/dL — AB
BUN: 36 mg/dL — AB
CHLORIDE: 97 mmol/L — AB
CREATININE: 1.76 mg/dL — AB
Calcium, Total: 8.9 mg/dL
Co2: 24 mmol/L
EGFR (African American): 45 — ABNORMAL LOW
EGFR (Non-African Amer.): 39 — ABNORMAL LOW
Glucose: 154 mg/dL — ABNORMAL HIGH
Potassium: 5.9 mmol/L — ABNORMAL HIGH
SGOT(AST): 192 U/L — ABNORMAL HIGH
SODIUM: 132 mmol/L — AB
TOTAL PROTEIN: 8 g/dL

## 2015-03-07 LAB — CBC
HCT: 37.4 % — ABNORMAL LOW (ref 40.0–52.0)
HGB: 11.6 g/dL — AB (ref 13.0–18.0)
MCH: 26 pg (ref 26.0–34.0)
MCHC: 30.9 g/dL — AB (ref 32.0–36.0)
MCV: 84 fL (ref 80–100)
PLATELETS: 460 10*3/uL — AB (ref 150–440)
RBC: 4.46 10*6/uL (ref 4.40–5.90)
RDW: 21 % — ABNORMAL HIGH (ref 11.5–14.5)
WBC: 13.7 10*3/uL — ABNORMAL HIGH (ref 3.8–10.6)

## 2015-03-07 LAB — TROPONIN I: TROPONIN-I: 0.05 ng/mL — AB

## 2015-03-07 LAB — PROTIME-INR
INR: 1.4
Prothrombin Time: 17.5 secs — ABNORMAL HIGH

## 2015-03-07 LAB — LIPASE, BLOOD: Lipase: 23 U/L

## 2015-03-07 LAB — MAGNESIUM: Magnesium: 2.1 mg/dL

## 2015-03-08 LAB — COMPREHENSIVE METABOLIC PANEL
ALBUMIN: 3 g/dL — AB
ALK PHOS: 190 U/L — AB
Anion Gap: 9 (ref 7–16)
BUN: 39 mg/dL — ABNORMAL HIGH
Bilirubin,Total: 1.1 mg/dL
CALCIUM: 8.7 mg/dL — AB
CHLORIDE: 100 mmol/L — AB
CREATININE: 1.68 mg/dL — AB
Co2: 24 mmol/L
EGFR (African American): 48 — ABNORMAL LOW
EGFR (Non-African Amer.): 41 — ABNORMAL LOW
Glucose: 75 mg/dL
Potassium: 5.1 mmol/L
SGOT(AST): 206 U/L — ABNORMAL HIGH
SGPT (ALT): 146 U/L — ABNORMAL HIGH
Sodium: 133 mmol/L — ABNORMAL LOW
Total Protein: 7.1 g/dL

## 2015-03-08 LAB — ALBUMIN, FLUID (OTHER): BODY FLUID ALBUMIN: 1.4 g/dL

## 2015-03-08 LAB — CBC WITH DIFFERENTIAL/PLATELET
BASOS PCT: 0.7 %
Basophil #: 0.1 10*3/uL (ref 0.0–0.1)
EOS PCT: 0.8 %
Eosinophil #: 0.1 10*3/uL (ref 0.0–0.7)
HCT: 35.5 % — ABNORMAL LOW (ref 40.0–52.0)
HGB: 11.1 g/dL — AB (ref 13.0–18.0)
Lymphocyte #: 1.6 10*3/uL (ref 1.0–3.6)
Lymphocyte %: 11.4 %
MCH: 25.9 pg — AB (ref 26.0–34.0)
MCHC: 31.2 g/dL — ABNORMAL LOW (ref 32.0–36.0)
MCV: 83 fL (ref 80–100)
MONO ABS: 1.1 x10 3/mm — AB (ref 0.2–1.0)
MONOS PCT: 8 %
Neutrophil #: 10.9 10*3/uL — ABNORMAL HIGH (ref 1.4–6.5)
Neutrophil %: 79.1 %
Platelet: 434 10*3/uL (ref 150–440)
RBC: 4.26 10*6/uL — AB (ref 4.40–5.90)
RDW: 21.4 % — ABNORMAL HIGH (ref 11.5–14.5)
WBC: 13.8 10*3/uL — AB (ref 3.8–10.6)

## 2015-03-09 LAB — BASIC METABOLIC PANEL
ANION GAP: 6 — AB (ref 7–16)
BUN: 44 mg/dL — ABNORMAL HIGH
CO2: 28 mmol/L
Calcium, Total: 8.3 mg/dL — ABNORMAL LOW
Chloride: 99 mmol/L — ABNORMAL LOW
Creatinine: 1.98 mg/dL — ABNORMAL HIGH
EGFR (Non-African Amer.): 34 — ABNORMAL LOW
GFR CALC AF AMER: 39 — AB
Glucose: 102 mg/dL — ABNORMAL HIGH
POTASSIUM: 4.6 mmol/L
Sodium: 133 mmol/L — ABNORMAL LOW

## 2015-03-09 LAB — URINE CULTURE

## 2015-03-09 LAB — BODY FLUID CELL COUNT WITH DIFFERENTIAL
BASOS ABS: 0 %
EOS PCT: 0 %
Lymphocytes: 14 %
Neutrophils: 33 %
Nucleated Cell Count: 924 /mm3
OTHER MONONUCLEAR CELLS: 53 %
Other Cells BF: 0 %

## 2015-03-10 LAB — COMPREHENSIVE METABOLIC PANEL
ALK PHOS: 142 U/L — AB
ANION GAP: 8 (ref 7–16)
Albumin: 2.4 g/dL — ABNORMAL LOW
BILIRUBIN TOTAL: 0.5 mg/dL
BUN: 35 mg/dL — ABNORMAL HIGH
CALCIUM: 8.2 mg/dL — AB
CHLORIDE: 100 mmol/L — AB
CO2: 29 mmol/L
Creatinine: 1.38 mg/dL — ABNORMAL HIGH
EGFR (African American): >60
GFR CALC NON AF AMER: 52 — AB
GLUCOSE: 96 mg/dL
POTASSIUM: 3.9 mmol/L
SGOT(AST): 100 U/L — ABNORMAL HIGH
SGPT (ALT): 117 U/L — ABNORMAL HIGH
SODIUM: 137 mmol/L
Total Protein: 5.7 g/dL — ABNORMAL LOW

## 2015-03-10 LAB — CBC WITH DIFFERENTIAL/PLATELET
BASOS ABS: 0.1 10*3/uL (ref 0.0–0.1)
BASOS PCT: 0.5 %
EOS PCT: 7.6 %
Eosinophil #: 0.9 10*3/uL — ABNORMAL HIGH (ref 0.0–0.7)
HCT: 30.3 % — ABNORMAL LOW (ref 40.0–52.0)
HGB: 9.4 g/dL — AB (ref 13.0–18.0)
LYMPHS PCT: 5.3 %
Lymphocyte #: 0.6 10*3/uL — ABNORMAL LOW (ref 1.0–3.6)
MCH: 26.2 pg (ref 26.0–34.0)
MCHC: 31.2 g/dL — ABNORMAL LOW (ref 32.0–36.0)
MCV: 84 fL (ref 80–100)
Monocyte #: 0.5 x10 3/mm (ref 0.2–1.0)
Monocyte %: 3.9 %
NEUTROS ABS: 10 10*3/uL — AB (ref 1.4–6.5)
Neutrophil %: 82.7 %
Platelet: 320 10*3/uL (ref 150–440)
RBC: 3.61 10*6/uL — ABNORMAL LOW (ref 4.40–5.90)
RDW: 20.6 % — ABNORMAL HIGH (ref 11.5–14.5)
WBC: 12.1 10*3/uL — AB (ref 3.8–10.6)

## 2015-03-10 LAB — URINALYSIS, COMPLETE
BLOOD: NEGATIVE
Bacteria: NONE SEEN
Bilirubin,UR: NEGATIVE
Glucose,UR: NEGATIVE mg/dL (ref 0–75)
Ketone: NEGATIVE
Leukocyte Esterase: NEGATIVE
Nitrite: NEGATIVE
PROTEIN: NEGATIVE
Ph: 6 (ref 4.5–8.0)
RBC,UR: <1 /HPF (ref 0–5)
SPECIFIC GRAVITY: 1.008 (ref 1.003–1.030)
SQUAMOUS EPITHELIAL: NONE SEEN
WBC UR: 1 /HPF (ref 0–5)

## 2015-03-10 LAB — PROTEIN / CREATININE RATIO, URINE
Creatinine, Urine Random: 16 mg/dL (ref 30–125)
Protein, Urine: <6 mg/dL — ABNORMAL LOW (ref 0–9)

## 2015-03-11 LAB — BASIC METABOLIC PANEL
ANION GAP: 10 (ref 7–16)
BUN: 31 mg/dL — AB
CALCIUM: 8 mg/dL — AB
CHLORIDE: 96 mmol/L — AB
Co2: 30 mmol/L
Creatinine: 1.25 mg/dL — ABNORMAL HIGH
EGFR (African American): >60
GFR CALC NON AF AMER: 59 — AB
GLUCOSE: 126 mg/dL — AB
POTASSIUM: 4 mmol/L
Sodium: 136 mmol/L

## 2015-04-13 NOTE — Consult Note (Signed)
Chief Complaint:  Subjective/Chief Complaint Pt denies abdominal pain, nausea, or vomiting.  States "I feel fine".   VITAL SIGNS/ANCILLARY NOTES: **Vital Signs.:   28-Mar-16 08:19  Vital Signs Type Q 4hr  Temperature Temperature (F) 97.2  Celsius 36.2  Pulse Pulse 110  Respirations Respirations 18  Systolic BP Systolic BP 110  Diastolic BP (mmHg) Diastolic BP (mmHg) 72  Mean BP 84  Pulse Ox % Pulse Ox % 94  Pulse Ox Activity Level  At rest  Oxygen Delivery Room Air/ 21 %   Brief Assessment:  GEN no acute distress, thin, A/Ox3   Respiratory normal resp effort   Gastrointestinal details normal Soft  Nontender  Bowel sounds normal  +HSM   EXTR positive edema, negative edema, No cyanosis, trace ankle edema bilaterally   Additional Physical Exam HEENT: sclera clear, nonicteric OP: pink, moist   Lab Results: Routine Chem:  28-Mar-16 05:52   Sodium, Serum 137 (135-145 NOTE: New Reference Range  02/18/15)   Assessment/Plan:  Assessment/Plan:  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  Etiology unknown, serolgic work-up pending.  Denies ETOH history. SBP: Improving on rocephin. [facility] & our plan of care is below.   Plan 1) Continue rocephin, can change to PO fluoroquinolone for 14 days once ready for dicsharge 2) Follow up pending labs 3) Continue supportive measures Please call if you have any questions or concerns   Electronic Signatures: Joselyn ArrowJones, Elmer Boutelle L (NP)  (Signed 28-Mar-16 11:04)  Authored: Chief Complaint, VITAL SIGNS/ANCILLARY NOTES, Brief Assessment, Lab Results, Assessment/Plan   Last Updated: 28-Mar-16 11:04 by Joselyn ArrowJones, Aireanna Luellen L (NP)

## 2015-04-13 NOTE — Consult Note (Signed)
PATIENT NAME:  [name] [name], [name] [name] MR#:  [id number] DATE OF BIRTH:  29-Oct-1946  DATE OF CONSULTATION:  03/07/2015  REFERRING PHYSICIAN:   CONSULTING PHYSICIAN:  Midge Miniumarren Shuna Tabor, MD CONSULTING SERVICE:  Gastroenterology.    REASON FOR CONSULTATION: Abnormal liver enzymes and ascites.   HISTORY OF PRESENT ILLNESS: This patient is a 69 year old gentleman who comes in with a history of being homeless up until recently where he became a resident [facility]. The patient states that he was told that his potassium was low and that he had to be brought to the hospital.  On admission to the hospital, the patient was found to have elevated liver enzymes with imaging showing nodular liver with ascites and signs suggestive of cirrhosis. The patient states that he has not drank in many years and was never a heavy drinker.  He also denies any blood transfusions before 1990. He also denies any high risk activity such as snorting cocaine or being in the healthcare field.  The patient also states that he has never been told that he had abnormal liver enzymes in the past.   PAST MEDICAL HISTORY: Type 2 diabetes, GERD.   SOCIAL HISTORY: Denies alcohol, tobacco or drug abuse.   FAMILY HISTORY: Noncontributory.   ALLERGIES: No known drug allergies.   REVIEW OF SYSTEMS:  A 10 point review of systems was negative except what was stated above.    HOME MEDICATIONS:  Acetaminophen, glimepiride, Claritin, Lasix and potassium.   PHYSICAL EXAMINATION:  GENERAL: The patient sitting up in bed, alert and oriented x 3.  VITAL SIGNS: Temperature 97.5, pulse 106, respirations 18, blood pressure 99/66, pulse oximetry 92%.  HEENT: Normocephalic, atraumatic. Extraocular motor intact. Pupils equally round and reactive to light and accommodation without JVD, without lymphadenopathy.  LUNGS: Clear to auscultation bilaterally.  HEART: Regular rate and rhythm without murmurs, rubs, or gallops.  ABDOMEN: Soft, slightly  distended with positive fluid wave, without hepatosplenomegaly.  EXTREMITIES: Without cyanosis, clubbing, or edema.  NEUROLOGICAL: Cranial nerves II through XII grossly intact.  SKIN: Without any rashes or lesions.    LABORATORY DATA:  As stated above, with increased liver enzymes, alkaline phosphatase of 221 on admission that came down to 190 this morning. The AST on admission was 192, which is up to 206 today and the ALT was 119, which is now up to 142. The patient's hemoglobin was 11.6 on admission and 11.1 today.  The patient's albumin today is 3 with the ascitic fluid albumin of 1.4 with a SAAG equaling 1.6 consistent with portal hypertension. The patient's urinalysis also showed a large number of white cells with +3 bacteria and yeast with cast.   ASSESSMENT AND PLAN: This patient is a 69 year old gentleman who comes in with abnormal liver enzymes and a high serum ascites albumin gradient of 1.6. The patient denies any risk factors for cirrhosis, although he appears to have cirrhosis on imaging and has an ascitic fluid tap that is consistent with portal hypertension.  The patient will have labs sent off for other possible causes of abnormal liver enzymes and cirrhosis.  I will follow the patient along with you.   Thank you very much for involving me in the care of this patient. If you have any questions, please do not hesitate to call.     ____________________________ Midge Miniumarren Ashland Osmer, MD dw:DT D: 03/08/2015 14:06:00 ET T: 03/08/2015 14:42:58 ET JOB#: [id number]  cc: Midge Miniumarren Marchel Foote, MD, <Dictator> Midge MiniumARREN Cutberto Winfree MD ELECTRONICALLY SIGNED 03/08/2015 16:41

## 2015-04-13 NOTE — Consult Note (Signed)
Chief Complaint:  Subjective/Chief Complaint The patient reports having a sore throat this am. No other complaints. His ascitic fluid was consistant with portal HTN but the WBC count was not ordered. Have ordered labs for other possible causes of the presumed cirrhosis.   VITAL SIGNS/ANCILLARY NOTES: **Vital Signs.:   27-Mar-16 05:00  Respirations Respirations 18    07:34  Vital Signs Type Q 4hr  Temperature Temperature (F) 97.6  Celsius 36.4  Temperature Source oral  Pulse Pulse 111  Respirations Respirations 16  Systolic BP Systolic BP 151  Diastolic BP (mmHg) Diastolic BP (mmHg) 80  Mean BP 90  Pulse Ox % Pulse Ox % 95  Pulse Ox Activity Level  At rest  Oxygen Delivery Room Air/ 21 %   Brief Assessment:  GEN well developed, well nourished, no acute distress   Respiratory normal resp effort  no use of accessory muscles   EXTR negative cyanosis/clubbing, negative edema   Lab Results: Routine Chem:  27-Mar-16 05:27   Glucose, Serum  102 (65-99 NOTE: New Reference Range  02/18/15)  BUN  44 (6-20 NOTE: New Reference Range  02/18/15)  Creatinine (comp)  1.98 (0.61-1.24 NOTE: New Reference Range  02/18/15)  Sodium, Serum  133 (135-145 NOTE: New Reference Range  02/18/15)  Potassium, Serum 4.6 (3.5-5.1 NOTE: New Reference Range  02/18/15)  Chloride, Serum  99 (101-111 NOTE: New Reference Range  02/18/15)  CO2, Serum 28 (22-32 NOTE: New Reference Range  02/18/15)  Calcium (Total), Serum  8.3 (8.9-10.3 NOTE: New Reference Range  02/18/15)  Anion Gap  6  eGFR (African American)  39  eGFR (Non-African American)  34 (eGFR values <15m/min/1.73 m2 may be an indication of chronic kidney disease (CKD). Calculated eGFR is useful in patients with stable renal function. The eGFR calculation will not be reliable in acutely ill patients when serum creatinine is changing rapidly. It is not useful in patients on dialysis. The eGFR calculation may not be applicable to  patients at the low and high extremes of body sizes, pregnant women, and vegetarians.)  Result Comment LABS - This specimen was collected through an   - indwelling catheter or arterial line.  - A minimum of 541m of blood was wasted prior    - to collecting the sample.  Interpret  - results with caution.  Result(s) reported on 09 Mar 2015 at 05:53AM.   Assessment/Plan:  Assessment/Plan:  Assessment Cirrhosis   Plan Ascitic fluid ordered for CBC to R/O SBP. Awaiting blood tests for possible cause of presumed cirrhosis.   Electronic Signatures: WoLucilla LameMD)  (Signed 27-Mar-16 07:49)  Authored: Chief Complaint, VITAL SIGNS/ANCILLARY NOTES, Brief Assessment, Lab Results, Assessment/Plan   Last Updated: 27-Mar-16 07:49 by WoLucilla LameMD)

## 2015-04-13 NOTE — H&P (Signed)
PATIENT NAME:  [name] [name], [name] [name] MR#:  [id number] DATE OF BIRTH:  1946/07/16  DATE OF ADMISSION:  03/07/2015  REFERRING PHYSICIAN: Gladstone Pihavid Schaevitz, MD   PRIMARY CARE PHYSICIAN: Dr[facility].  CHIEF COMPLAINT: Abdominal pain, abnormal labs   HISTORY OF PRESENT ILLNESS: A 69 year old Caucasian gentleman with a history of reflux disease without esophagitis; type 2 diabetes, non-insulin-requiring; as well as ascites requiring paracentesis in the past presenting with abdominal pain and abnormal lab. The patient is a resident [facility]. Noted to have low potassium, thus was started on a remarkably aggressive potassium supplementation schedule. I rechecked and noted his potassium levels to be high. He has been complaining of abdominal pain for 1-2 weeks in total duration, suprapubic and periumbilical in location, constant. Described mainly as pain or pressure, 4-5/10 in intensity, no worsening or relieving factors. No associated fevers, chills, further symptomatology. Does attest to having lower extremity edema, which has been gradually worsening over the last 2 months.   REVIEW OF SYSTEMS:  CONSTITUTIONAL: Denies fevers, chills, fatigue, weakness.  EYES: Denies blurred vision, double vision, or eye pain.  EARS, NOSE, AND THROAT: Denies tinnitus, ear pain, or hearing loss.  RESPIRATORY: Denies cough, wheeze, shortness of breath.  CARDIOVASCULAR: Denies chest pain, palpitations. Positive for edema, as stated above.  GASTROINTESTINAL: Denies nausea, vomiting, diarrhea. Positive for abdominal pain.  GENITOURINARY: Denies dysuria or hematuria.  ENDOCRINE: Denies nocturia or thyroid problems.  HEMATOLOGIC AND LYMPHATIC: Denies easy bruising, bleeding.   SKIN: Denies rash or lesions.  MUSCULOSKELETAL: Denies pain in neck, back, shoulder, knees, hips, or arthritic symptoms.  NEUROLOGIC: Denies paralysis, paresthesias.  PSYCHIATRIC: Denies anxiety or depressive symptoms. Otherwise, a  full review of systems performed by me is negative.    PAST MEDICAL HISTORY: Includes type 2 diabetes, non-insulin-requiring; gastroesophageal reflux disease without esophagitis; history of ascites requiring paracentesis, although the patient is sketchy on further details.   SOCIAL HISTORY: Denies any current alcohol, tobacco, or drug usage.   FAMILY HISTORY: Positive for coronary artery disease.   ALLERGIES: No known drug allergies.   HOME MEDICATIONS: Include acetaminophen 325 mg 2 tablets p.o. q. 6 hours as needed for pain; glimepiride 2 mg p.o. daily; Claritin 10 mg p.o. daily; Lasix 40 mg p.o. daily; potassium 40 mEq 3 times daily, which the patient self-discontinued about 2 days ago; acidophilus 1 capsule daily; pantoprazole 40 mg p.o. daily.   PHYSICAL EXAMINATION:  VITAL SIGNS: Temperature 97.4, heart rate of 107, respirations 18, blood pressure 101/86, saturating 96% on room air. Weight is 91.2 kg, BMI 28.9.  GENERAL: A chronically ill-appearing Caucasian gentleman, currently in no acute distress.  HEAD: Normocephalic, atraumatic.  EYES: Pupils equal, round, reactive to light. Extraocular muscles intact. No scleral icterus.  MOUTH: Moist mucus membrane. Dentition intact. No abscess noted. EAR, NOSE, AND THROAT: Clear without exudates. No external lesions. NECK: Supple. No thyromegaly or nodules. No JVD.  PULMONARY: Clear to auscultation bilaterally without wheezes, rales, or rhonchi. No use of accessory muscles. Good respiratory effort. CHEST: Nontender to palpation.  CARDIOVASCULAR: S1, S2, regular rate and rhythm. No murmurs, rubs, or gallops. Gross edema 3+, essentially anasarca from abdomen and downwards. Pedal pulses 2+ bilaterally. GASTROINTESTINAL: Soft, nontender. Positive distention, positive fluid wave. Hypoactive bowel sounds. No appreciable hepatosplenomegaly but difficult examination given ascites.  MUSCULOSKELETAL: No swelling, clubbing. Positive for edema, as stated  above. Range of motion full in all extremities.  NEUROLOGIC: Cranial nerves II-XII intact. No gross focal neurological deficits. Sensation  intact. Reflexes intact.  SKIN: No ulceration, lesions, rashes, or cyanosis. Skin warm, dry. Turgor intact.  PSYCHIATRIC: Mood, affect within normal limits. The patient is awake, alert, oriented x 3. Insight and judgment intact.   LABORATORY DATA: CT abdomen and pelvis performed, which reveals minimally nodular contour of the liver suggestive of cirrhosis as well as diffuse moderate ascites. Remainder of laboratory data: Sodium of 132, potassium 5.9, chloride of 97, bicarbonate of 24, BUN 36, creatinine 1.76, glucose 154. LFTs: Total protein 8, albumin 3.3, bilirubin 1.3, alkaline phosphatase 221, AST 192, ALT 119; no labs for comparison. Troponin of 0.05. WBC of 13.7, hemoglobin of 11.6, platelets of 460,000. Urinalysis: WBCs 123, RBCs 242, leukocyte esterase 3+, nitrite negative, epithelial cells 20.   ASSESSMENT AND PLAN: A 69 year old Caucasian gentleman with history of gastroesophageal reflux disease without esophagitis; type 2 diabetes, non-insulin-requiring; as well as ascites requiring paracentesis in the past presenting with abdominal pain and abnormal labs.  1.  Ascites/anasarca. Will require a therapeutic paracentesis. We will check an INR prior. We will also check fluid albumin levels to calculate SAAG score. I suspect this is related to underlying cirrhosis given his exam and CT findings. We will also consult gastroenterology, as the patient and family have limited insight to this diagnosis and he will need further outpatient followup.  2.  Acute kidney injury with volume overload but intravascular depletion. We will continue with careful diuresis. Follow urine output and renal function.  3.  Hyperkalemia, which is iatrogenic. We will hold further supplementation. Follow potassium levels.  4.  Elevated troponin. We will trend cardiac enzymes x 3. Place on  telemetry.  5.  Urinary tract infection. Received ceftriaxone in the Emergency Department, continue this for a full course duration. Follow culture data. Adjust antibiotics accordingly.  6.  Gastroesophageal reflux disease without esophagitis. Proton pump inhibitor.  7.  Venous thromboembolism prophylaxis. Sequential compression devices.  CODE STATUS: The patient is a full code.   TIME SPENT: 45 minutes.   ____________________________ Cletis Athens. Elivia Robotham, MD dkh:bm D: 03/07/2015 21:57:58 ET T: 03/07/2015 23:48:00 ET JOB#: [id number]  cc: Cletis Athens. Ladawn Boullion, MD, <Dictator> Ambry Dix Synetta Shadow MD ELECTRONICALLY SIGNED 03/08/2015 1:37

## 2015-04-13 NOTE — Consult Note (Signed)
Brief Consult Note: Diagnosis: The patient was admitted and found to have increased LFT's and imaging showing a nodular liver. He has ascites and had it tapped this am.   Patient was seen by consultant.   Consult note dictated.   Comments: The patient has signs of cirrhosis. He denies Etoh abuse or high risk activity. Will wait for ascitic fluid results. Will also send off blood for possible orther causes.  Electronic Signatures: Midge MiniumWohl, Kerstin Crusoe (MD)  (Signed 26-Mar-16 12:50)  Authored: Brief Consult Note   Last Updated: 26-Mar-16 12:50 by Midge MiniumWohl, Nasrin Lanzo (MD)

## 2015-04-13 NOTE — Consult Note (Signed)
Chief Complaint:  Subjective/Chief Complaint Pt denies abdominal pain, nausea, or vomiting.   VITAL SIGNS/ANCILLARY NOTES: **Vital Signs.:   29-Mar-16 05:26  Vital Signs Type Routine  Temperature Temperature (F) 98  Celsius 36.6  Temperature Source oral  Pulse Pulse 114  Respirations Respirations 18  Systolic BP Systolic BP 97  Diastolic BP (mmHg) Diastolic BP (mmHg) 57  Mean BP 70  Pulse Ox % Pulse Ox % 93  Pulse Ox Activity Level  At rest  Oxygen Delivery Room Air/ 21 %    07:55  Vital Signs Type Routine  Temperature Temperature (F) 96.8  Celsius 36  Pulse Pulse 113  Respirations Respirations 18  Systolic BP Systolic BP 165  Diastolic BP (mmHg) Diastolic BP (mmHg) 62  Mean BP 76  Pulse Ox % Pulse Ox % 93  Pulse Ox Activity Level  At rest  Oxygen Delivery Room Air/ 21 %   Brief Assessment:  GEN no acute distress, thin, A/Ox3   Respiratory normal resp effort   Gastrointestinal details normal Soft  Nontender  Bowel sounds normal  +HSM   EXTR positive edema, negative edema, No cyanosis, trace ankle edema bilaterally   Additional Physical Exam HEENT: sclera clear, nonicteric OP: pink, moist   Lab Results:  Routine Chem:  29-Mar-16 00:25   Glucose, Serum  126 (65-99 NOTE: New Reference Range  02/18/15)  BUN  31 (6-20 NOTE: New Reference Range  02/18/15)  Creatinine (comp)  1.25 (0.61-1.24 NOTE: New Reference Range  02/18/15)  Sodium, Serum 136 (135-145 NOTE: New Reference Range  02/18/15)  Potassium, Serum 4.0 (3.5-5.1 NOTE: New Reference Range  02/18/15)  Chloride, Serum  96 (101-111 NOTE: New Reference Range  02/18/15)  CO2, Serum 30 (22-32 NOTE: New Reference Range  02/18/15)  Calcium (Total), Serum  8.0 (8.9-10.3 NOTE: New Reference Range  02/18/15)  Anion Gap 10  eGFR (African American) >60  eGFR (Non-African American)  59 (eGFR values <53m/min/1.73 m2 may be an indication of chronic kidney disease (CKD). Calculated eGFR is useful in  patients with stable renal function. The eGFR calculation will not be reliable in acutely ill patients when serum creatinine is changing rapidly. It is not useful in patients on dialysis. The eGFR calculation may not be applicable to patients at the low and high extremes of body sizes, pregnant women, and vegetarians.)   Assessment/Plan:  Assessment/Plan:  Assessment Cirrhosis with portal HTN/ascites: Etiology unknown, serolgic work-up negative.  Denies ETOH history. SBP: Improving on rocephin. [facility] our plan of care is below.   Plan 1) Continue rocephin, can change to PO fluoroquinolone for 14 days once ready for discharge 2) consider liver biopsy at later date 3) Continue supportive measures 4) will need Hep A/B vaccines Please call if you have any questions or concerns   Electronic Signatures: JAndria Meuse(NP)  (Signed 29-Mar-16 13:00)  Authored: Chief Complaint, VITAL SIGNS/ANCILLARY NOTES, Brief Assessment, Lab Results, Assessment/Plan   Last Updated: 29-Mar-16 13:00 by JAndria Meuse(NP)

## 2015-04-13 NOTE — Discharge Summary (Signed)
PATIENT NAME:  [name] [name], [name] [name] MR#:  [id number] DATE OF BIRTH:  [birth year]  DATE OF ADMISSION:  03/07/2015 DATE OF DISCHARGE:  03/12/2015  PRIMARY CARE PHYSICIAN: Nonlocal.  DISCHARGE DIAGNOSES: Spontaneous bacterial peritonitis, ascites, anasarca, acute renal failure, urinary tract infection, diabetes.  PROCEDURE: Status post therapeutic paracenteses with 3 liters fluid extraction.   CONDITION: Stable.   CODE STATUS: Full code.   HOME MEDICATIONS: Please refer to the medication reconciliation list.   DIET: Low-sodium, low-fat, low-cholesterol, ADA diet.   ACTIVITY: As tolerated.   FOLLOWUP CARE: Follow with PCP and Dr. Servando SnareWohl within 1 to 2 weeks. The patient needs home health and physical therapy.   REASON FOR ADMISSION: Abdominal pain and abnormal labs.   HOSPITAL COURSE: The patient is a 69 year old Caucasian male with a history of diabetes and ascites, has had abdominal pain for 1-2 weeks. In addition, the patient has lower extremity edema which has been worsening over the last 2 months. For detailed history and physical examination, please refer to the admission note dictated by Dr. Clint GuyHower.   LABORATORY DATA: Showed a CAT scan of the abdomen and pelvis, reveals minimally nodular contour of liver suggestive of liver cirrhosis and diffuse moderate ascites.   BUN 36, creatinine 1.7, sodium 132, potassium 5.9, chloride 97, bicarbonate 24. Bilirubin 1.3, alkaline phosphatase 221, AST 192, ALT 119. WBC 13.7, hemoglobin 11.6. Urinalysis showed wbc 123, rbc 242, nitrite negative.   ASSESSMENT AND PLAN: 1.  Ascites with anasarca. After admission, the patient got a paracentesis with 3 liters fluid drawn.  2.  Spontaneous bacterial peritonitis. The patient has been treated with Rocephin 2 grams IV PB. According to Dr. Servando SnareWohl, antibiotics can be changed to p.o. Levaquin for an additional 14 days after discharge. The patient's abdominal pain is much better.  3.  Acute renal failure, possibly due  to volume. The patient's creatinine increased to 1.98, then decreased to 1.25 yesterday. Dr. Cherylann RatelLateef evaluated the patient, suggested to continue Lasix for ascites and liver cirrhosis. He suggested start spironolactone, but due to the patient's low sided blood pressure, I will continue Lasix 40 mg daily without spironolactone and follow up GI as outpatient. 4.  Hyperkalemia, improved.  5.  Urinary tract infection. As mentioned above, the patient got antibiotics. The patient has no symptoms. Ultrasound of kidney is unremarkable.   The patient has no complaints. The patient underwent physical therapy. The patient's vital signs are stable. He is clinically stable, will be discharged to home with home health and physical therapy. Discussed the patient's discharge plan with the patient, nurse, and case manager; also discussed with Dr. Cherylann RatelLateef.   TIME SPENT: About 42 minutes.    ____________________________ Shaune PollackQing Jacara Benito, MD qc:TM D: 03/12/2015 15:57:17 ET T: 03/12/2015 20:00:36 ET JOB#: [id number]  cc: Shaune PollackQing Dot Splinter, MD, <Dictator> Shaune PollackQING Alysiana Ethridge MD ELECTRONICALLY SIGNED 03/13/2015 19:06

## 2015-10-04 IMAGING — CT CT ABD-PELV W/ CM
2 of 5 series · 16 of 46 positions shown, 18 images · IV contrast (omnipaque)
Comparison: Abdominal radiographs same date

CLINICAL DATA: Abdominal pain, right-sided, elevated liver function
tests

EXAM:
CT ABDOMEN AND PELVIS WITH CONTRAST
TECHNIQUE: Multidetector CT imaging of the abdomen and pelvis was performed
using the standard protocol following bolus administration of
intravenous contrast.
CONTRAST:  80 cc Omnipaque 300 IV contrast

[Series 4: routine abd pel with · axial · 0.85mm/px · z∈[-418,+32]mm · 13 of 101 slices shown, 15 images]
[im 6/101  soft-tissue]
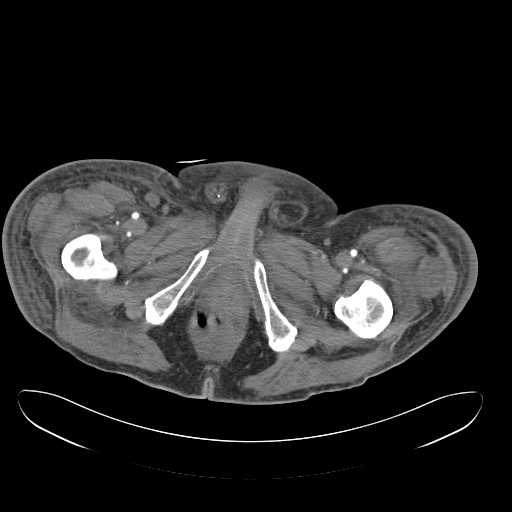
[im 6/101  bone]
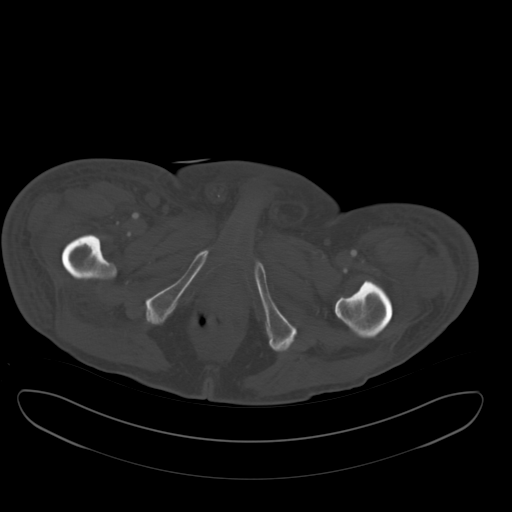
[im 16/101  soft-tissue]
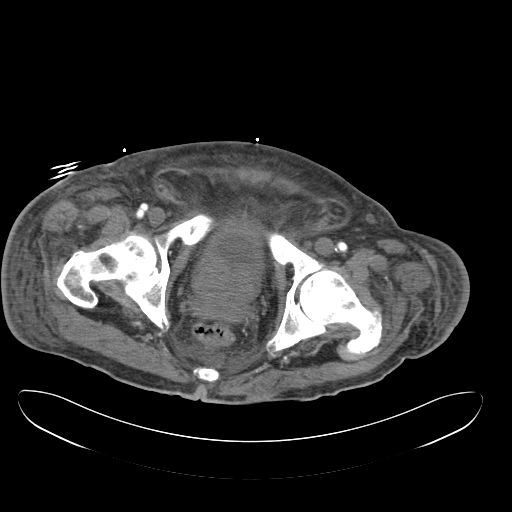
[im 21/101  soft-tissue]
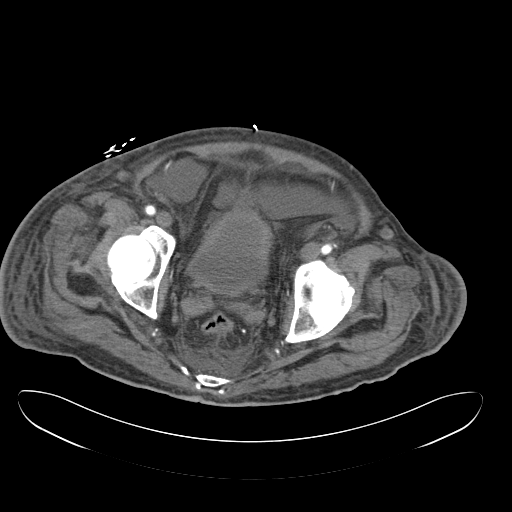
[im 31/101  soft-tissue]
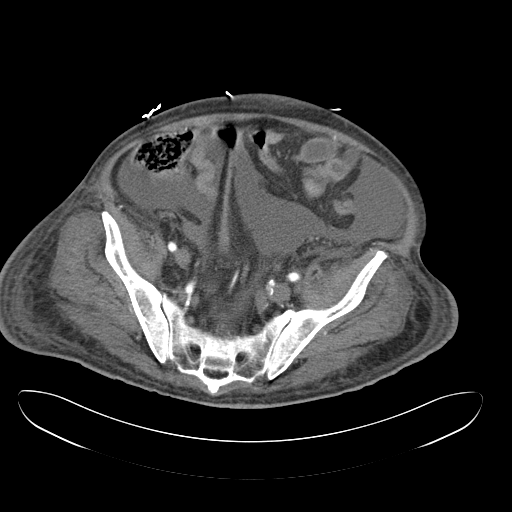
[im 36/101  soft-tissue]
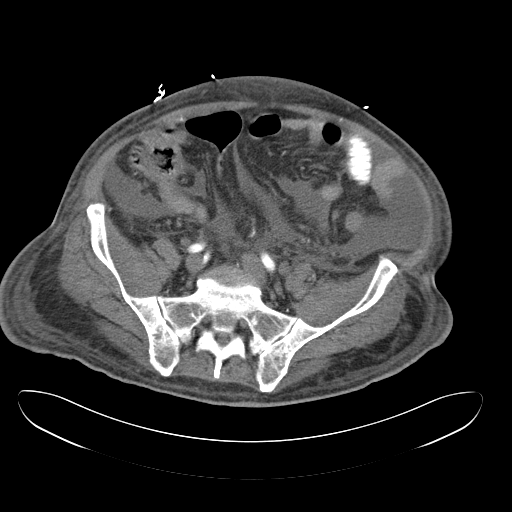
[im 46/101  soft-tissue]
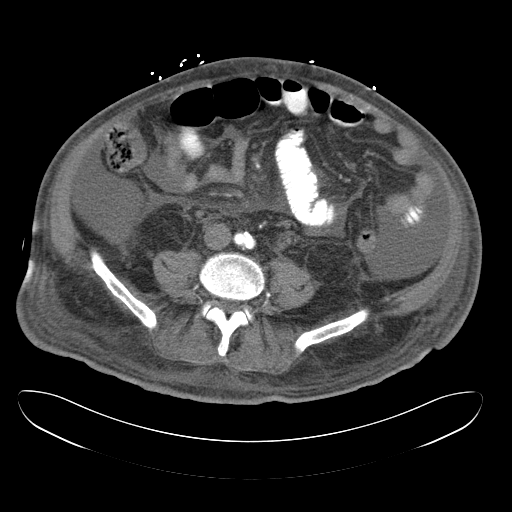
[im 51/101  soft-tissue]
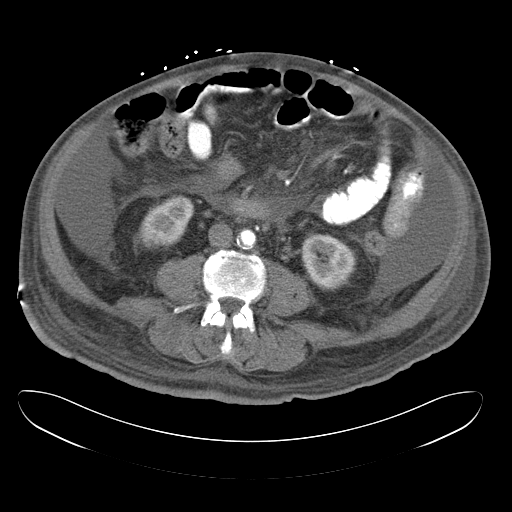
[im 56/101  soft-tissue]
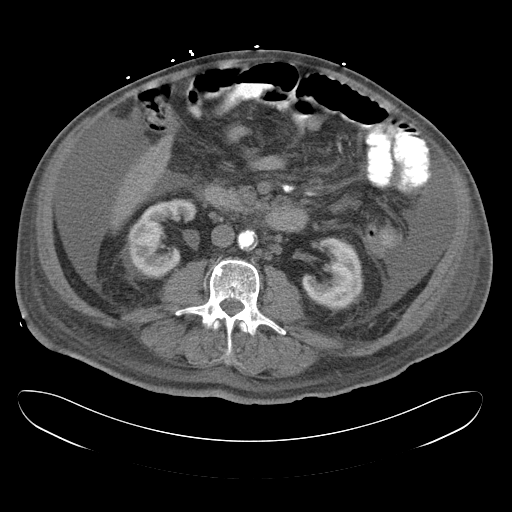
[im 66/101  soft-tissue]
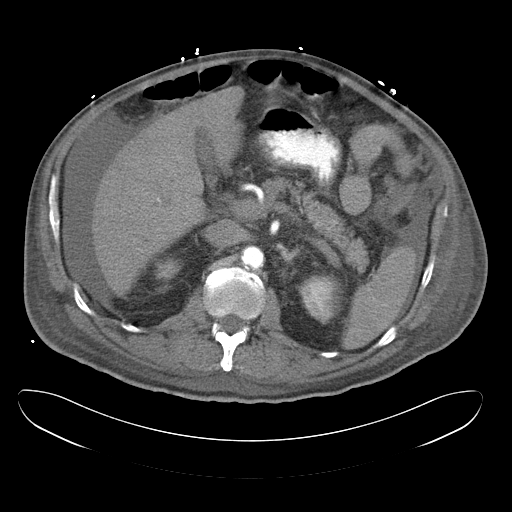
[im 66/101  bone]
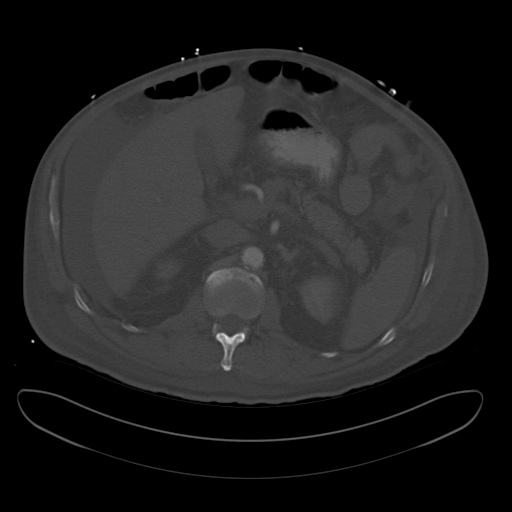
[im 71/101  soft-tissue]
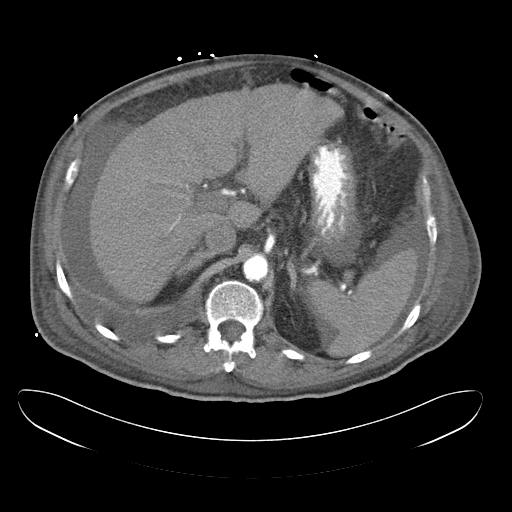
[im 81/101  soft-tissue]
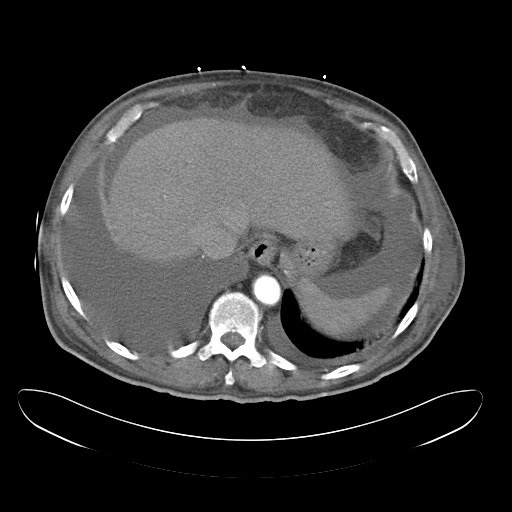
[im 86/101  soft-tissue]
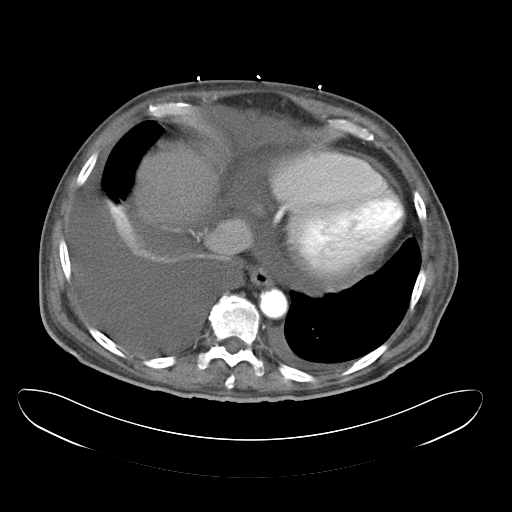
[im 96/101  soft-tissue]
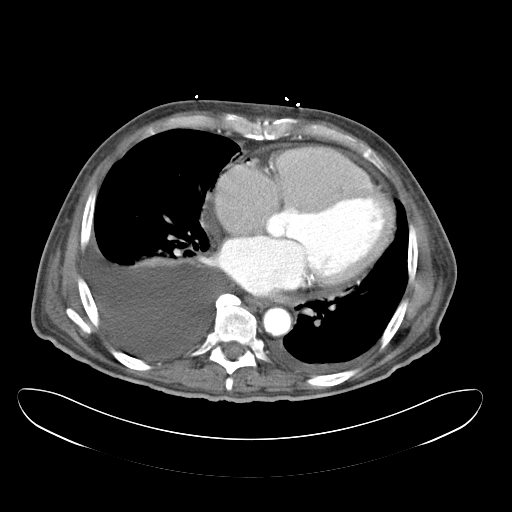

[Series 8: cor routine abd pel with · coronal · 0.90mm/px · 3 of 167 slices shown]
[im 56/167  soft-tissue]
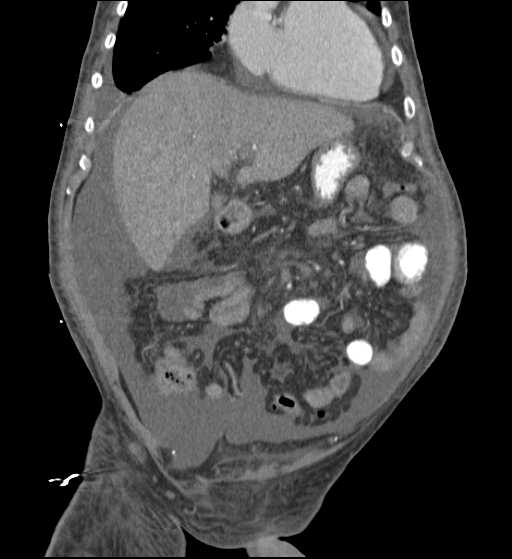
[im 74/167  soft-tissue]
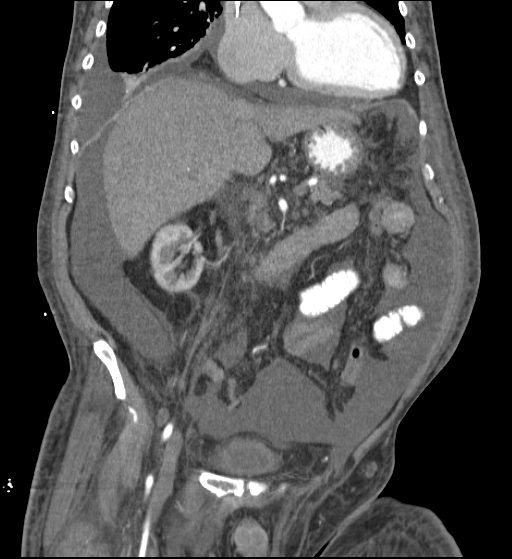
[im 93/167  soft-tissue]
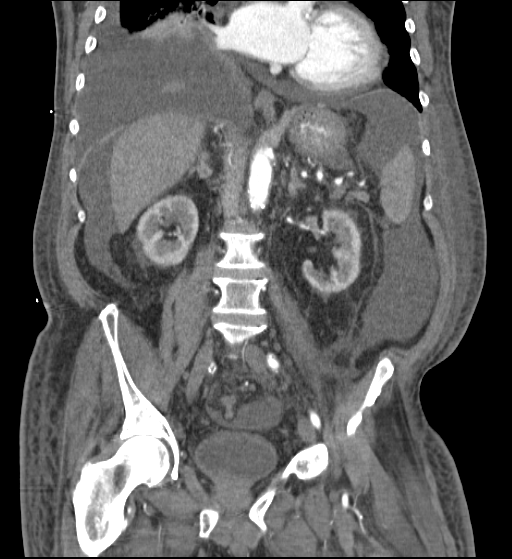

[16 of 46 positions shown; findings below may reference images not displayed]

FINDINGS: Lower chest: Moderate-sized right and trace left pleural effusions
are noted with associated presumed compressive atelectasis.
Cardiomegaly is noted. Small pericardial effusion. Tip of right
previously seen presumed PICC line is identified at the cavoatrial
junction.

Hepatobiliary: Minimally nodular contour which may suggest cirrhosis
in the appropriate clinical context. Possible gallstone within the
gallbladder neck versus volume averaging image 37. No other specific
CT evidence to suggest cholecystitis. No focal hepatic abnormality.

Pancreas: Normal

Spleen: Normal

Adrenals/Urinary Tract: 1 cm right adrenal mass image 32. Left
adrenal gland appears normal. No hydroureteronephrosis.
Nonobstructing 2 mm right lower renal pole calculus image 49. No
radiopaque ureteral or bladder calculus. Bladder is decompressed
with wall thickness at upper limits of normal measuring 3 mm
concentrically. Small amount of perinephric fluid is identified.

Stomach/Bowel: Several loops of left mid abdominal small bowel
measure upper limits of normal in caliber, 2.6 cm image 51, without
overt dilation by standard criteria. Appendix not visualized but no
evidence for acute appendicitis is identified. Colon is decompressed
and unremarkable.

Vascular/Lymphatic: 1 cm representative retroperitoneal node
identified image 47. No pelvic lymphadenopathy. Moderate
atheromatous aortic and branch vessel calcification noted.

Other: Small fat containing left inguinal hernia. Prostatomegaly is
noted, 5.9 cm image 90. Diffuse moderate ascites is present.

Musculoskeletal: Lower lumbar spine disc degenerative change is
identified. No acute osseous abnormality.
IMPRESSION: Diffuse moderate ascites. This is nonspecific and could be related
to underlying liver disease given the presence of minimal hepatic
nodularity suggesting cirrhosis.

Possible gallstone without other CT evidence for acute
cholecystitis.

Moderate right and trace left pleural effusions with associated
compressive atelectasis.

Cardiomegaly and small pericardial effusion.

1 cm nonspecific right adrenal nodule. Further outpatient
nonemergent workup is recommended with abdominal MRI adrenal mass
protocol without and with IV contrast.

## 2015-10-09 IMAGING — US US RENAL KIDNEY
1 series · 14 of 25 positions shown · non-contrast
Comparison: CT, 03/07/2015.

CLINICAL DATA: Acute renal failure.  Urinary tract infection.

EXAM:
RENAL/URINARY TRACT ULTRASOUND COMPLETE

[Series 1: us renal kidney · 0.24mm/px · 14 of 31 slices shown]
[im 1/31]
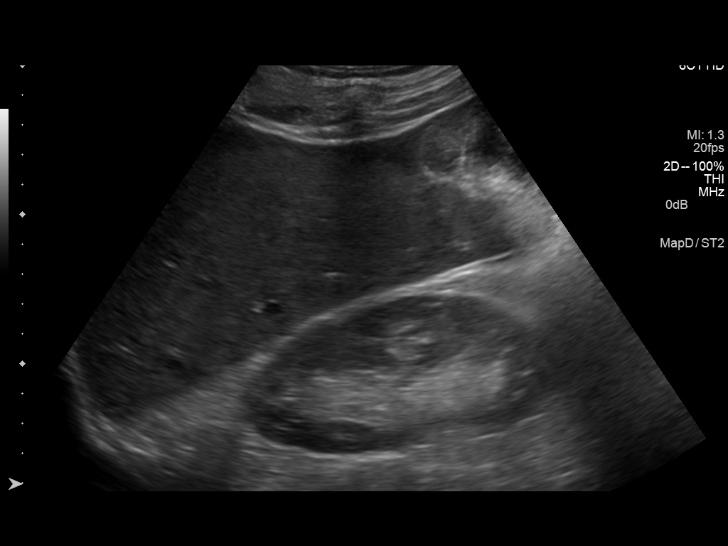
[im 3/31]
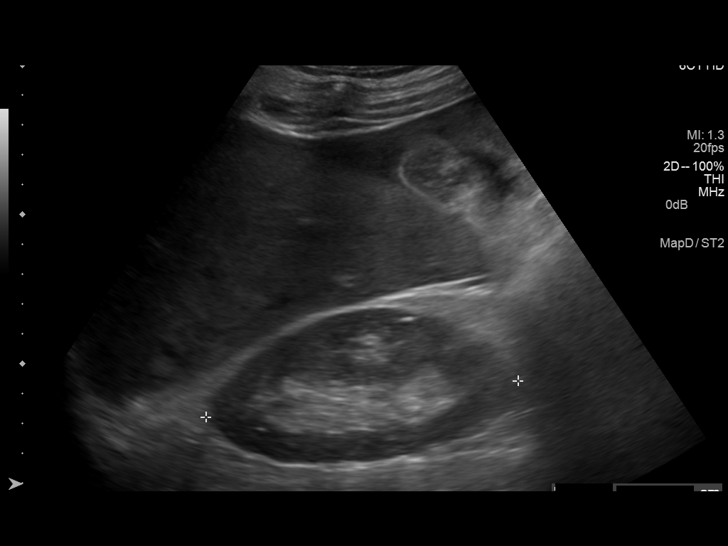
[im 6/31]
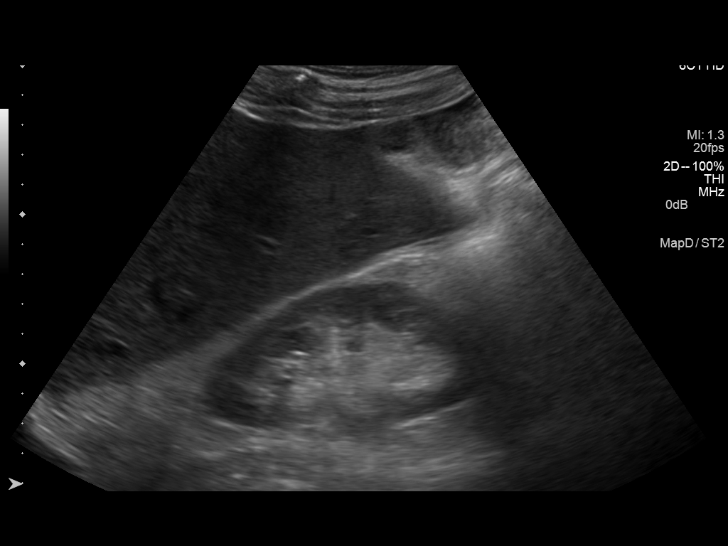
[im 8/31]
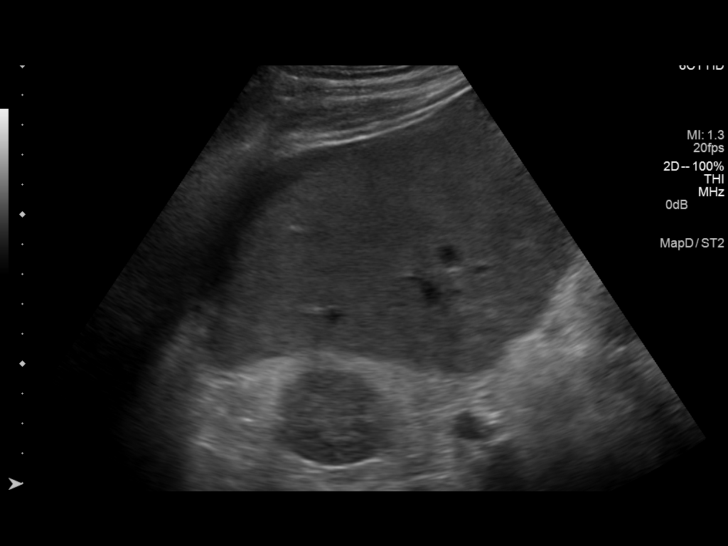
[im 11/31]
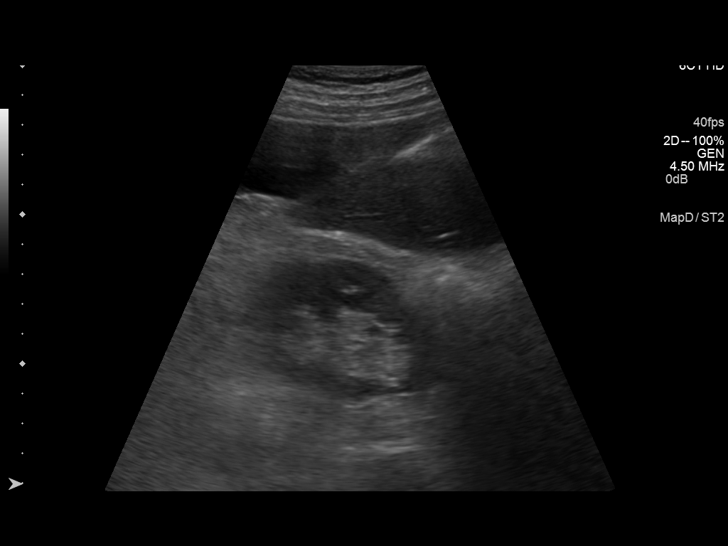
[im 12/31]
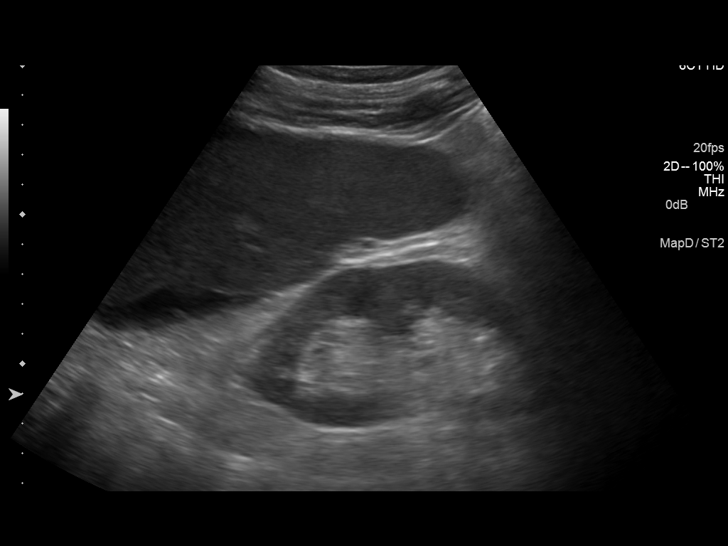
[im 14/31]
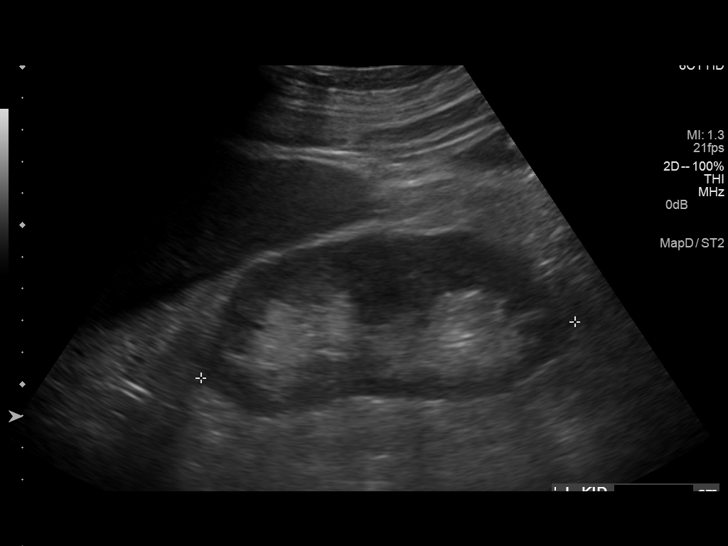
[im 17/31]
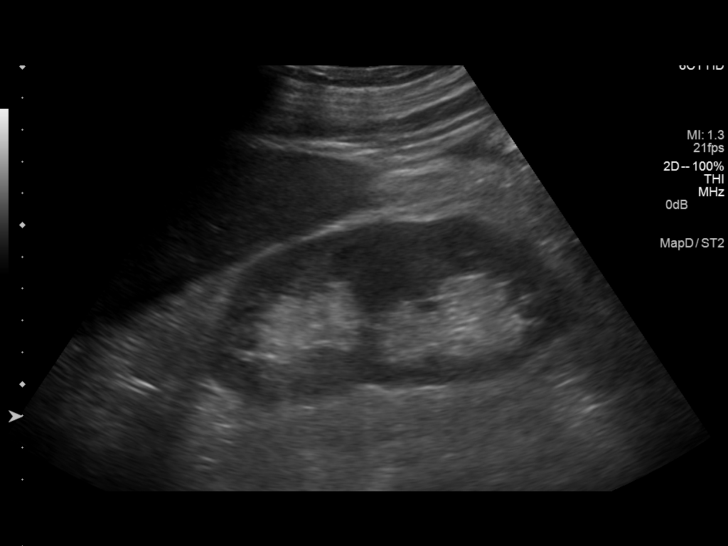
[im 19/31]
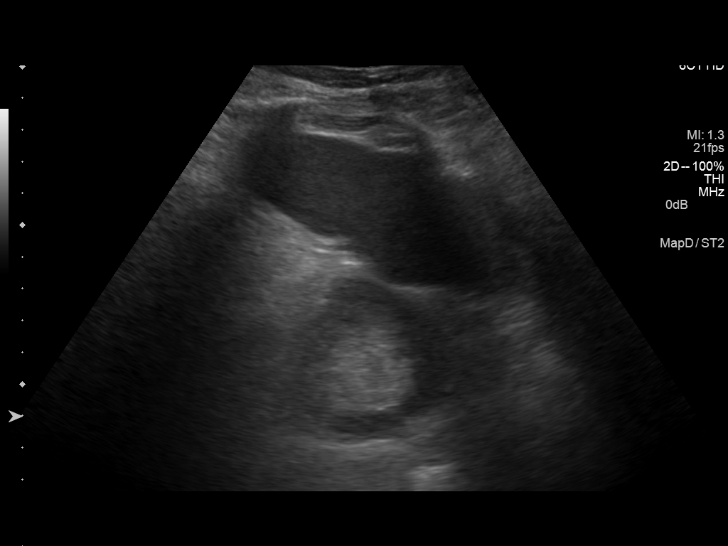
[im 21/31]
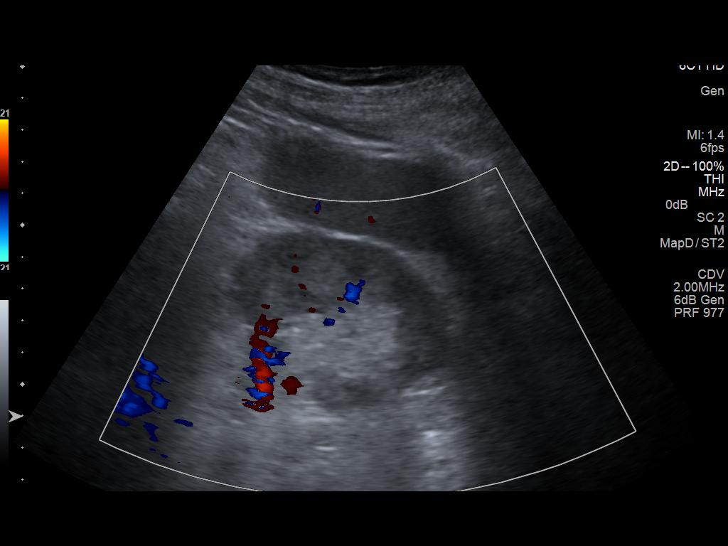
[im 23/31]
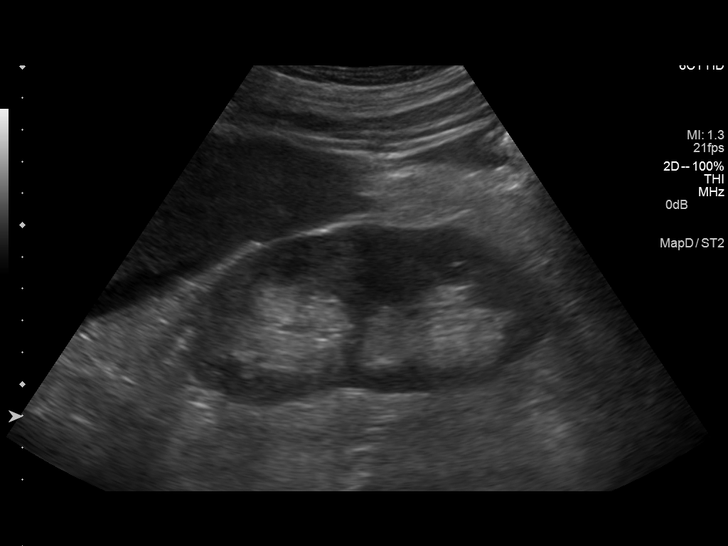
[im 26/31]
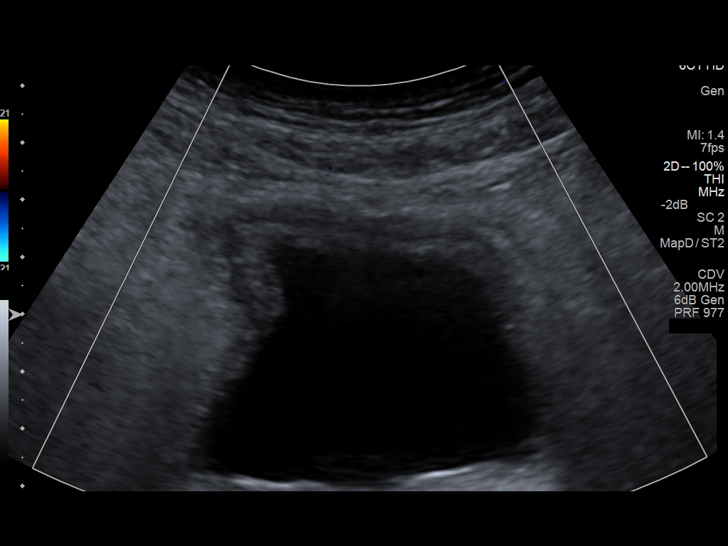
[im 28/31]
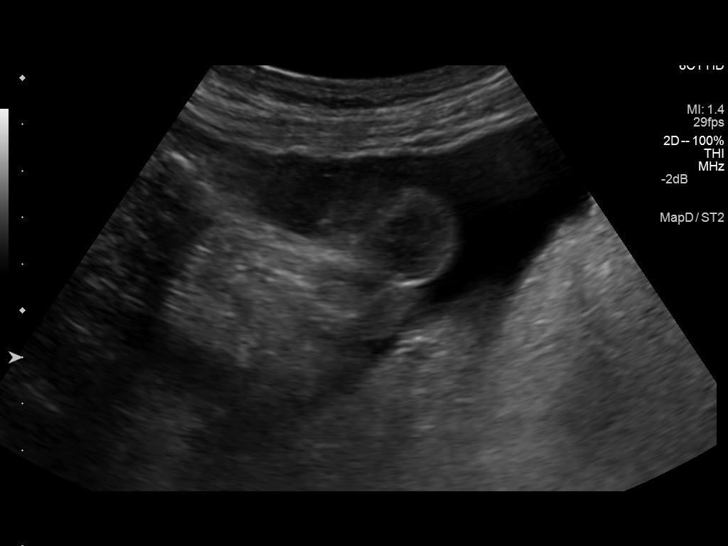
[im 31/31]
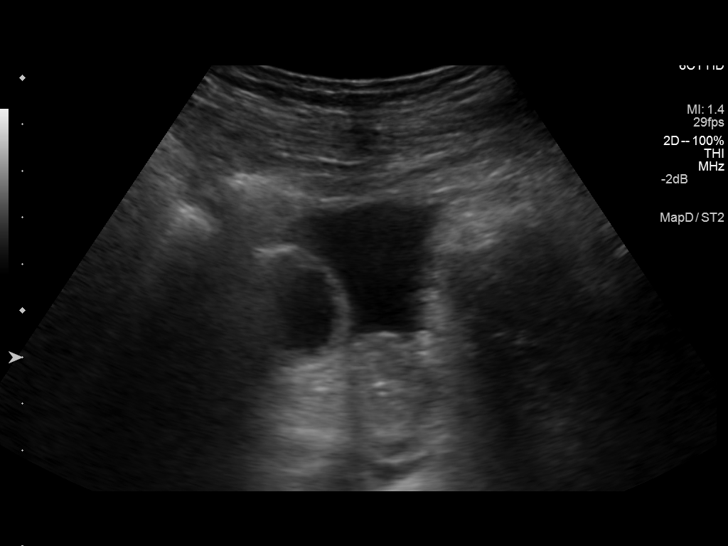

[14 of 25 positions shown; findings below may reference images not displayed]

FINDINGS: Right Kidney:

Length: 10.5 cm. Echogenicity within normal limits. No mass or
hydronephrosis visualized.

Left Kidney:

Length: 11.9 cm. Echogenicity within normal limits. No mass or
hydronephrosis visualized.

Bladder:

Mildly distended. Wall appears thickened measuring 8 mm. No mass or
stone.

Other findings:

Small amount ascites. Liver appears nodular with a coarsened
echotexture consistent with cirrhosis.
IMPRESSION: Normal kidneys.  No hydronephrosis.

Bladder wall is thickened consistent with cystitis.

Ascites and liver apparent suggesting cirrhosis, similar to the
recent prior CT.

## 2021-08-13 DEATH — deceased
